# Patient Record
Sex: Female | Born: 1955
Health system: Southern US, Community
[De-identification: ages and names within clinical notes are randomized; demographics above are authoritative.]

## PROBLEM LIST (undated history)

## (undated) DIAGNOSIS — R42 Dizziness and giddiness: Secondary | ICD-10-CM

## (undated) DIAGNOSIS — M810 Age-related osteoporosis without current pathological fracture: Secondary | ICD-10-CM

## (undated) DIAGNOSIS — D219 Benign neoplasm of connective and other soft tissue, unspecified: Secondary | ICD-10-CM

## (undated) HISTORY — DX: Benign neoplasm of connective and other soft tissue, unspecified: D21.9

## (undated) HISTORY — DX: Age-related osteoporosis without current pathological fracture: M81.0

## (undated) HISTORY — PX: HERNIA REPAIR: SHX51

---

## 1997-02-21 HISTORY — PX: MYOMECTOMY VAGINAL APPROACH: SUR871

## 2005-08-10 ENCOUNTER — Other Ambulatory Visit: Admission: RE | Admit: 2005-08-10 | Discharge: 2005-08-10 | Payer: Self-pay | Admitting: Obstetrics and Gynecology

## 2005-09-28 ENCOUNTER — Encounter: Admission: RE | Admit: 2005-09-28 | Discharge: 2005-09-28 | Payer: Self-pay | Admitting: Obstetrics and Gynecology

## 2005-12-02 ENCOUNTER — Emergency Department (HOSPITAL_COMMUNITY): Admission: EM | Admit: 2005-12-02 | Discharge: 2005-12-02 | Payer: Self-pay | Admitting: Family Medicine

## 2006-08-15 ENCOUNTER — Other Ambulatory Visit: Admission: RE | Admit: 2006-08-15 | Discharge: 2006-08-15 | Payer: Self-pay | Admitting: Obstetrics and Gynecology

## 2006-09-11 ENCOUNTER — Emergency Department (HOSPITAL_COMMUNITY): Admission: EM | Admit: 2006-09-11 | Discharge: 2006-09-11 | Payer: Self-pay | Admitting: Emergency Medicine

## 2006-10-03 ENCOUNTER — Encounter: Admission: RE | Admit: 2006-10-03 | Discharge: 2006-10-03 | Payer: Self-pay | Admitting: Obstetrics and Gynecology

## 2007-09-26 ENCOUNTER — Other Ambulatory Visit: Admission: RE | Admit: 2007-09-26 | Discharge: 2007-09-26 | Payer: Self-pay | Admitting: Obstetrics and Gynecology

## 2008-04-08 ENCOUNTER — Encounter: Admission: RE | Admit: 2008-04-08 | Discharge: 2008-04-08 | Payer: Self-pay | Admitting: Obstetrics and Gynecology

## 2008-04-25 ENCOUNTER — Ambulatory Visit (HOSPITAL_COMMUNITY): Admission: RE | Admit: 2008-04-25 | Discharge: 2008-04-25 | Payer: Self-pay | Admitting: Gastroenterology

## 2008-10-30 ENCOUNTER — Encounter: Payer: Self-pay | Admitting: Obstetrics and Gynecology

## 2008-10-30 ENCOUNTER — Other Ambulatory Visit: Admission: RE | Admit: 2008-10-30 | Discharge: 2008-10-30 | Payer: Self-pay | Admitting: Obstetrics and Gynecology

## 2008-10-30 ENCOUNTER — Ambulatory Visit: Payer: Self-pay | Admitting: Obstetrics and Gynecology

## 2008-12-02 ENCOUNTER — Ambulatory Visit: Payer: Self-pay | Admitting: Obstetrics and Gynecology

## 2009-05-05 ENCOUNTER — Encounter: Admission: RE | Admit: 2009-05-05 | Discharge: 2009-05-05 | Payer: Self-pay | Admitting: Obstetrics and Gynecology

## 2009-05-07 ENCOUNTER — Encounter: Admission: RE | Admit: 2009-05-07 | Discharge: 2009-05-07 | Payer: Self-pay | Admitting: Obstetrics and Gynecology

## 2009-11-09 ENCOUNTER — Ambulatory Visit: Payer: Self-pay | Admitting: Obstetrics and Gynecology

## 2009-11-09 ENCOUNTER — Other Ambulatory Visit: Admission: RE | Admit: 2009-11-09 | Discharge: 2009-11-09 | Payer: Self-pay | Admitting: Obstetrics and Gynecology

## 2009-11-12 ENCOUNTER — Encounter: Admission: RE | Admit: 2009-11-12 | Discharge: 2009-11-12 | Payer: Self-pay | Admitting: Obstetrics and Gynecology

## 2010-06-17 ENCOUNTER — Other Ambulatory Visit: Payer: Self-pay | Admitting: Obstetrics and Gynecology

## 2010-06-17 DIAGNOSIS — Z09 Encounter for follow-up examination after completed treatment for conditions other than malignant neoplasm: Secondary | ICD-10-CM

## 2010-06-24 ENCOUNTER — Ambulatory Visit
Admission: RE | Admit: 2010-06-24 | Discharge: 2010-06-24 | Disposition: A | Discharge status: Home / Self Care | Payer: 59 | Source: Ambulatory Visit | Attending: Obstetrics and Gynecology | Admitting: Obstetrics and Gynecology

## 2010-06-24 ENCOUNTER — Other Ambulatory Visit: Payer: Self-pay | Admitting: Obstetrics and Gynecology

## 2010-06-24 DIAGNOSIS — Z09 Encounter for follow-up examination after completed treatment for conditions other than malignant neoplasm: Secondary | ICD-10-CM

## 2010-10-20 ENCOUNTER — Ambulatory Visit: Payer: 59 | Admitting: Obstetrics and Gynecology

## 2010-10-22 ENCOUNTER — Encounter: Payer: Self-pay | Admitting: Gynecology

## 2010-10-22 DIAGNOSIS — M8580 Other specified disorders of bone density and structure, unspecified site: Secondary | ICD-10-CM | POA: Insufficient documentation

## 2010-10-27 ENCOUNTER — Ambulatory Visit (INDEPENDENT_AMBULATORY_CARE_PROVIDER_SITE_OTHER): Payer: 59 | Admitting: Obstetrics and Gynecology

## 2010-10-27 ENCOUNTER — Encounter: Payer: Self-pay | Admitting: Obstetrics and Gynecology

## 2010-10-27 DIAGNOSIS — Z780 Asymptomatic menopausal state: Secondary | ICD-10-CM

## 2010-10-27 DIAGNOSIS — N951 Menopausal and female climacteric states: Secondary | ICD-10-CM

## 2010-10-27 DIAGNOSIS — N950 Postmenopausal bleeding: Secondary | ICD-10-CM

## 2010-10-27 NOTE — Progress Notes (Signed)
Patient came to see me today with the following complaints. For the past 6+ weeks she has had PMS type symptoms [place] if she's can start a period.several years ago she had an elevated FSH at over 100. She was on Vagifem but stopped it 8 weeks ago. She has now been spotting for the past week.  Ext:wnl, vaginal exam within normal limits. Cervix is clean and closed. Uterus is retroverted normal size and shape. Adnexa fails to reveal masses.  Assessment: 1. Menstrual cramping 2. Postmenopausal bleeding.  Plan: Endometrial biopsy obtained with very little tissue. FSH rechecked. [facility] scheduled.

## 2010-11-12 ENCOUNTER — Other Ambulatory Visit: Payer: Self-pay | Admitting: Dermatology

## 2010-11-16 ENCOUNTER — Encounter: Payer: Self-pay | Admitting: Obstetrics and Gynecology

## 2010-11-16 ENCOUNTER — Other Ambulatory Visit (HOSPITAL_COMMUNITY)
Admission: RE | Admit: 2010-11-16 | Discharge: 2010-11-16 | Disposition: A | Discharge status: Home / Self Care | Payer: 59 | Source: Ambulatory Visit | Attending: Obstetrics and Gynecology | Admitting: Obstetrics and Gynecology

## 2010-11-16 ENCOUNTER — Ambulatory Visit (INDEPENDENT_AMBULATORY_CARE_PROVIDER_SITE_OTHER): Payer: 59 | Admitting: Obstetrics and Gynecology

## 2010-11-16 VITALS — BP 120/74 | Ht 64.0 in | Wt 122.0 lb

## 2010-11-16 DIAGNOSIS — Z01419 Encounter for gynecological examination (general) (routine) without abnormal findings: Secondary | ICD-10-CM | POA: Insufficient documentation

## 2010-11-16 DIAGNOSIS — N950 Postmenopausal bleeding: Secondary | ICD-10-CM

## 2010-11-16 NOTE — Progress Notes (Signed)
Patient came to see me today for her annual GYN exam. She's had no further bleeding since we saw her the other day and did her endometrial biopsy. It came back normal. Her FSH is still in menopausal range. She continues to have mammograms on time. She has an appointment for [facility] tomorrow but needs to reschedule.  Physical examination: HEENT within normal limits. Neck: Thyroid not large. No masses. Supraclavicular nodes: not enlarged. Breasts: Examined in both sitting midline position. No skin changes and no masses. Abdomen: Soft no guarding rebound or masses or hernia. Pelvic: External: Within normal limits. BUS: Within normal limits. Vaginal:within normal limits. Good estrogen effect. No evidence of cystocele rectocele or enterocele. Cervix: clean. Uterus: Normal size and shape. Adnexa: No masses. Rectovaginal exam: Confirmatory and negative. Extremities: Within normal limits.  Assessment: Postmenopausal bleeding  Plan: Proceed with [facility]

## 2010-11-17 ENCOUNTER — Ambulatory Visit: Payer: 59 | Admitting: Obstetrics and Gynecology

## 2010-11-17 ENCOUNTER — Inpatient Hospital Stay: Admission: RE | Admit: 2010-11-17 | Payer: 59 | Source: Ambulatory Visit

## 2010-12-21 ENCOUNTER — Ambulatory Visit (INDEPENDENT_AMBULATORY_CARE_PROVIDER_SITE_OTHER): Payer: 59 | Admitting: Obstetrics and Gynecology

## 2010-12-21 ENCOUNTER — Ambulatory Visit (INDEPENDENT_AMBULATORY_CARE_PROVIDER_SITE_OTHER): Payer: 59

## 2010-12-21 ENCOUNTER — Other Ambulatory Visit: Payer: Self-pay | Admitting: Obstetrics and Gynecology

## 2010-12-21 DIAGNOSIS — D259 Leiomyoma of uterus, unspecified: Secondary | ICD-10-CM

## 2010-12-21 DIAGNOSIS — N950 Postmenopausal bleeding: Secondary | ICD-10-CM

## 2010-12-21 DIAGNOSIS — D251 Intramural leiomyoma of uterus: Secondary | ICD-10-CM

## 2010-12-21 DIAGNOSIS — D219 Benign neoplasm of connective and other soft tissue, unspecified: Secondary | ICD-10-CM

## 2010-12-21 DIAGNOSIS — D252 Subserosal leiomyoma of uterus: Secondary | ICD-10-CM

## 2010-12-21 DIAGNOSIS — N83339 Acquired atrophy of ovary and fallopian tube, unspecified side: Secondary | ICD-10-CM

## 2010-12-21 DIAGNOSIS — N854 Malposition of uterus: Secondary | ICD-10-CM

## 2010-12-21 NOTE — Progress Notes (Signed)
Patient came back for a saline infusion histogram because of postmenopausal bleeding. She had a normal endometrial biopsy. She says that since then she's had no bleeding. She's also had no more PMS for the last month.  Assessment: Postmenopausal bleeding  Plan: The patient had an ultrasound. She has multiple intramural and subserosal myomas. There all small. See ultrasound report for size. The largest is 2 cm. Her endometrial echo is 2.1 mm. Neither ovary has pathology. Her cul-de-sac is free of fluid. Saline was introduced into the cavity and she has a symmetrical cavity without intrauterine defects. She was reassured. She will report new bleeding.

## 2011-06-14 ENCOUNTER — Encounter (HOSPITAL_COMMUNITY): Payer: Self-pay | Admitting: Emergency Medicine

## 2011-06-14 ENCOUNTER — Emergency Department (INDEPENDENT_AMBULATORY_CARE_PROVIDER_SITE_OTHER)
Admission: EM | Admit: 2011-06-14 | Discharge: 2011-06-14 | Disposition: A | Discharge status: Home / Self Care | Payer: 59 | Source: Home / Self Care | Attending: Emergency Medicine | Admitting: Emergency Medicine

## 2011-06-14 DIAGNOSIS — K529 Noninfective gastroenteritis and colitis, unspecified: Secondary | ICD-10-CM

## 2011-06-14 DIAGNOSIS — K5289 Other specified noninfective gastroenteritis and colitis: Secondary | ICD-10-CM

## 2011-06-14 HISTORY — DX: Dizziness and giddiness: R42

## 2011-06-14 MED ORDER — ONDANSETRON 8 MG PO TBDP: 8.0000 mg | ORAL_TABLET | Freq: Three times a day (TID) | ORAL | Status: AC | PRN

## 2011-06-14 MED ORDER — MECLIZINE HCL 25 MG PO TABS: 25.0000 mg | ORAL_TABLET | Freq: Four times a day (QID) | ORAL | Status: AC

## 2011-06-14 MED ORDER — SODIUM CHLORIDE 0.9 % IV SOLN
INTRAVENOUS | Status: DC
  Administered 2011-06-14 (×2): via INTRAVENOUS

## 2011-06-14 MED ORDER — ONDANSETRON HCL 4 MG/2ML IJ SOLN
4.0000 mg | Freq: Once | INTRAMUSCULAR | Status: AC
  Administered 2011-06-14: 4 mg via INTRAVENOUS

## 2011-06-14 MED ORDER — ONDANSETRON HCL 4 MG/2ML IJ SOLN
INTRAMUSCULAR | Status: AC
  Filled 2011-06-14: qty 2

## 2011-06-14 NOTE — ED Notes (Signed)
Provided blanket and pillow

## 2011-06-14 NOTE — ED Notes (Signed)
Reports feeling fine until 2:45 pm.  Felt dizzy and started throwing up.  Currently has abdominal pain, nausea, dizzy, no diarrhea, yet.  Reports eating a [place] wrap, yogurt with blueberries-ate around 12:30 p.m. Today.  Woke this am with slight head stuffiness, headache -thought to be allergies-excedrin .

## 2011-06-14 NOTE — Discharge Instructions (Signed)

## 2011-06-14 NOTE — ED Notes (Signed)
Patient is resting comfortably. States nausea eased and feels some better.

## 2011-06-14 NOTE — ED Notes (Signed)
Report to marie byrd, rn

## 2011-06-14 NOTE — ED Provider Notes (Signed)
Chief Complaint  Patient presents with  . 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 Today while she was working, doing pulmonary function tests, she developed sudden onset of nausea and vomiting. She denies any abdominal pain or cramping. She didn't have any diarrhea. She felt chilled and noted  whirling vertigo. She has had vertigo in the past. She thinks that the nausea preceded the vertigo. She also notes some headache and nasal congestion. She denies any exposures. No suspicious ingestions, foreign travel, or animal exposure.  Review of Systems:  Other than noted above, the patient denies any of the following symptoms: Systemic:  No fevers, chills, sweats, weight loss or gain, fatigue, or tiredness. ENT:  No nasal congestion, rhinorrhea, or sore throat. Lungs:  No cough, wheezing, or shortness of breath. Cardiac:  No chest pain, syncope, or presyncope. GI:  No abdominal pain, nausea, vomiting, anorexia, diarrhea, constipation, blood in stool or vomitus. GU:  No dysuria, frequency, or urgency.  PMFSH:  Past medical history, family history, social history, meds, and allergies were reviewed.  Physical Exam:   Vital signs:  BP 118/71  Pulse 76  Temp(Src) 97.7 F (36.5 C) (Oral)  Resp 16  SpO2 99%  LMP 10/22/2008 General:  Alert and oriented.  In no distress.  Skin warm and dry.  Good skin turgor, brisk capillary refill. ENT:  No scleral icterus, moist mucous membranes, no oral lesions, pharynx clear. Lungs:  Breath sounds clear and equal bilaterally.  No wheezes, rales, or rhonchi. Heart:  Rhythm regular, without extrasystoles.  No gallops or murmers. Abdomen:  Abdomen was soft, flat, nontender, nondistended. There was no organomegaly or mass. Bowel sounds are normally active. Skin: Clear, warm, and dry.  Good turgor.  Brisk capillary refill.  Course in Urgent Care Center:   She was given a liter of normal saline IV and  Zofran 4 mg IV. Thereafter she felt quite a bit better. She did not have any further emesis. Her dizziness was improved. She felt like should could go home.  Assessment:  The encounter diagnosis was Gastroenteritis.  Plan:   1.  The following meds were prescribed:   New Prescriptions   MECLIZINE (ANTIVERT) 25 MG TABLET    Take 1 tablet (25 mg total) by mouth 4 (four) times daily.   ONDANSETRON (ZOFRAN ODT) 8 MG DISINTEGRATING TABLET    Take 1 tablet (8 mg total) by mouth every 8 (eight) hours as needed for nausea.   2.  The patient was instructed in symptomatic care and handouts were given. 3.  The patient was told to return if becoming worse in any way, if no better in 2 or 3 days, and given some red flag symptoms that would indicate earlier return. 4.  The patient was told to take only sips of clear liquids for the next 24 hours and then advance to a b.r.a.t. Diet.      Reuben Likes, MD 06/14/11 [phone]

## 2011-07-06 ENCOUNTER — Other Ambulatory Visit: Payer: Self-pay | Admitting: *Deleted

## 2011-07-06 DIAGNOSIS — N649 Disorder of breast, unspecified: Secondary | ICD-10-CM

## 2011-07-22 ENCOUNTER — Ambulatory Visit
Admission: RE | Admit: 2011-07-22 | Discharge: 2011-07-22 | Disposition: A | Discharge status: Home / Self Care | Payer: 59 | Source: Ambulatory Visit | Attending: Obstetrics and Gynecology | Admitting: Obstetrics and Gynecology

## 2011-07-22 ENCOUNTER — Other Ambulatory Visit: Payer: 59

## 2011-07-22 DIAGNOSIS — N649 Disorder of breast, unspecified: Secondary | ICD-10-CM

## 2011-08-18 ENCOUNTER — Telehealth: Payer: Self-pay | Admitting: *Deleted

## 2011-08-18 MED ORDER — ESTRADIOL 0.1 MG/GM VA CREA: TOPICAL_CREAM | VAGINAL | Status: DC

## 2011-08-18 NOTE — Telephone Encounter (Signed)
Pt informed with the below note, rx sent to pharmacy. 

## 2011-08-18 NOTE — Addendum Note (Signed)
Addended by: Aura Camps on: 08/18/2011 03:12 PM   Modules accepted: Orders

## 2011-08-18 NOTE — Telephone Encounter (Signed)
Estrace vaginal cream 1 g at bedtime in  her vagina for 2 weeks and then 3 times a week

## 2011-08-18 NOTE — Telephone Encounter (Signed)
Pt is calling requesting rx for vaginal dryness, pt has taken vagifem in past before, but states "that doesn't work good" pt would to try something else. Does pt need OV to discuss? Please advise

## 2011-11-17 ENCOUNTER — Ambulatory Visit (INDEPENDENT_AMBULATORY_CARE_PROVIDER_SITE_OTHER): Payer: 59 | Admitting: Obstetrics and Gynecology

## 2011-11-17 ENCOUNTER — Encounter: Payer: Self-pay | Admitting: Obstetrics and Gynecology

## 2011-11-17 VITALS — BP 114/70 | Ht 64.0 in | Wt 121.0 lb

## 2011-11-17 DIAGNOSIS — Z01419 Encounter for gynecological examination (general) (routine) without abnormal findings: Secondary | ICD-10-CM

## 2011-11-17 MED ORDER — ESTRADIOL 0.1 MG/GM VA CREA: TOPICAL_CREAM | VAGINAL | Status: DC

## 2011-11-17 NOTE — Progress Notes (Signed)
Patient came to see me today for her annual GYN exam. She uses vaginal estrogen cream with excellent results both for atrophic vaginitis and urinary frequency. She is having no vaginal bleeding. She is having no pelvic pain. She is not having systemic menopausal symptoms. She has always had normal Pap smears. Her last pap smear was 2012. She is up-to-date on mammograms. She is now back on routine screening after being watched carefully with a breast cyst. 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 Her baseline bone density showed minimal osteopenia without an elevated fracture risk. She has had no fractures. It is time for followup.  Physical examination:Margaret Dougherty present.HEENT within normal limits. Neck: Thyroid not large. No masses. Supraclavicular nodes: not enlarged. Breasts: Examined in both sitting and lying  position. No skin changes and no masses. Abdomen: Soft no guarding rebound or masses or hernia. Pelvic: External: Within normal limits. BUS: Within normal limits. Vaginal:within normal limits. Good estrogen effect. No evidence of cystocele rectocele or enterocele. Cervix: clean. Uterus: Normal size and shape. Adnexa: No masses. Rectovaginal exam: Confirmatory and negative. Extremities: Within normal limits.  Assessment: #1. Atrophic vaginitis #2. Osteopenia #3. Urinary frequency  Plan: Continue yearly mammograms. Continue vaginal estrogen cream. Followup bone density.The new Pap smear guidelines were discussed with the patient. No pap done.

## 2011-11-17 NOTE — Patient Instructions (Signed)
Schedule bone density at our office.

## 2011-11-18 LAB — URINALYSIS W MICROSCOPIC + REFLEX CULTURE
Bacteria, UA: NONE SEEN
Bilirubin Urine: NEGATIVE
Casts: NONE SEEN
Crystals: NONE SEEN
Glucose, UA: NEGATIVE mg/dL
Hgb urine dipstick: NEGATIVE
Ketones, ur: NEGATIVE mg/dL
Leukocytes, UA: NEGATIVE
Nitrite: NEGATIVE
Protein, ur: NEGATIVE mg/dL
Specific Gravity, Urine: 1.011 (ref 1.005–1.030)
Squamous Epithelial / LPF: NONE SEEN
Urobilinogen, UA: 0.2 mg/dL (ref 0.0–1.0)
pH: 6 (ref 5.0–8.0)

## 2011-11-23 ENCOUNTER — Encounter: Payer: Self-pay | Admitting: Obstetrics and Gynecology

## 2012-05-17 ENCOUNTER — Ambulatory Visit: Payer: Self-pay

## 2012-05-17 ENCOUNTER — Other Ambulatory Visit: Payer: Self-pay | Admitting: Occupational Medicine

## 2012-05-17 DIAGNOSIS — R7612 Nonspecific reaction to cell mediated immunity measurement of gamma interferon antigen response without active tuberculosis: Secondary | ICD-10-CM

## 2013-02-04 ENCOUNTER — Ambulatory Visit (INDEPENDENT_AMBULATORY_CARE_PROVIDER_SITE_OTHER): Payer: 59 | Admitting: Gynecology

## 2013-02-04 ENCOUNTER — Encounter: Payer: Self-pay | Admitting: Gynecology

## 2013-02-04 VITALS — BP 104/68 | Ht 65.0 in | Wt 115.6 lb

## 2013-02-04 DIAGNOSIS — M8580 Other specified disorders of bone density and structure, unspecified site: Secondary | ICD-10-CM

## 2013-02-04 DIAGNOSIS — M899 Disorder of bone, unspecified: Secondary | ICD-10-CM

## 2013-02-04 DIAGNOSIS — N952 Postmenopausal atrophic vaginitis: Secondary | ICD-10-CM

## 2013-02-04 DIAGNOSIS — Z01419 Encounter for gynecological examination (general) (routine) without abnormal findings: Secondary | ICD-10-CM

## 2013-02-04 DIAGNOSIS — D251 Intramural leiomyoma of uterus: Secondary | ICD-10-CM

## 2013-02-04 MED ORDER — ESTRADIOL 0.1 MG/GM VA CREA: TOPICAL_CREAM | VAGINAL | Status: DC

## 2013-02-04 NOTE — Progress Notes (Signed)
Margaret Dougherty 1955-10-14 [id number]        57 y.o.  [id number] for annual exam.  Former patient of Dr. Eda Paschal. Several issues noted below.  Past medical history,surgical history, problem list, medications, allergies, family history and social history were all reviewed and documented in the EPIC chart.  ROS:  Performed and pertinent positives and negatives are included in the history, assessment and plan .  Exam:  Margaret Dougherty assistant Filed Vitals:   02/04/13 1509  BP: 104/68  Height: 5\' 5"  (1.651 m)  Weight: 115 lb 9.6 oz (52.436 kg)   General appearance  Normal Skin grossly normal Head/Neck normal with no cervical or supraclavicular adenopathy thyroid normal Lungs  clear Cardiac RR, without RMG Abdominal  soft, nontender, without masses, organomegaly or hernia Breasts  examined lying and sitting without masses, retractions, discharge or axillary adenopathy. Pelvic  Ext/BUS/vagina  generalized atrophic changes  Cervix  Normal with atrophic changes  Uterus  anteverted, normal size, shape and contour, midline and mobile nontender   Adnexa  Without masses or tenderness    Anus and perineum  Normal   Rectovaginal  Normal sphincter tone without palpated masses or tenderness.    Assessment/Plan:  57 y.o. [id number] female for annual exam.   1. Postmenopausal/atrophic genital changes. Not having significant symptoms such as hot flashes or night sweats. No vaginal bleeding. Using Estrace cream 3 times weekly. Seems to help more with her bladder control than anything. Patient wants to continue. I reviewed the issue of vaginal estrogen absorption risks with thrombosis such as stroke heart attack DVT, endometrial stimulation and breast cancer issues. Patient knows to report any vaginal bleeding. I refilled her Estrace cream x1 year. 2. Pap smear 2012. No Pap smear done today. No history of abnormal Pap smears previously. Plan repeat Pap smear next year a 3 year interval. 3. Mammography 06/2011.  Patient knows she is overdue and agrees to schedule. SBE monthly reviewed. 4. Osteopenia. DEXA 2010 with T score -1.2. FRAX 4.4%/0.3%. Repeat DEXA now patient agrees to schedule. Increase calcium vitamin D reviewed. 5. Colonoscopy 8 years ago with planned repeat at 10 year interval. 6. Health maintenance. No blood work done as it is all done through her primary physician's office. Followup one year, sooner as needed.   Note: This document was prepared with digital dictation and possible smart phrase technology. Any transcriptional errors that result from this process are unintentional.   Margaret Lords MD, 3:42 PM 02/04/2013

## 2013-02-04 NOTE — Patient Instructions (Signed)
Followup for bone density as scheduled. Followup in one year for annual exam 

## 2013-02-05 LAB — URINALYSIS W MICROSCOPIC + REFLEX CULTURE
Bacteria, UA: NONE SEEN
Bilirubin Urine: NEGATIVE
Casts: NONE SEEN
Crystals: NONE SEEN
Glucose, UA: NEGATIVE mg/dL
Hgb urine dipstick: NEGATIVE
Ketones, ur: NEGATIVE mg/dL
Leukocytes, UA: NEGATIVE
Nitrite: NEGATIVE
Protein, ur: NEGATIVE mg/dL
Specific Gravity, Urine: 1.006 (ref 1.005–1.030)
Squamous Epithelial / LPF: NONE SEEN
Urobilinogen, UA: 0.2 mg/dL (ref 0.0–1.0)
pH: 6.5 (ref 5.0–8.0)

## 2013-04-04 ENCOUNTER — Encounter: Payer: Self-pay | Admitting: Gynecology

## 2013-04-04 ENCOUNTER — Telehealth: Payer: Self-pay | Admitting: Gynecology

## 2013-04-04 ENCOUNTER — Ambulatory Visit (INDEPENDENT_AMBULATORY_CARE_PROVIDER_SITE_OTHER): Payer: 59

## 2013-04-04 DIAGNOSIS — M8580 Other specified disorders of bone density and structure, unspecified site: Secondary | ICD-10-CM

## 2013-04-04 DIAGNOSIS — M898X9 Other specified disorders of bone, unspecified site: Secondary | ICD-10-CM

## 2013-04-04 DIAGNOSIS — M899 Disorder of bone, unspecified: Secondary | ICD-10-CM

## 2013-04-04 DIAGNOSIS — M949 Disorder of cartilage, unspecified: Secondary | ICD-10-CM

## 2013-04-04 NOTE — Telephone Encounter (Signed)
Tell patient that her bone density shows that she has lost a fair amount of calcium. She is not osteoporotic but has lost bone. Recommend making sure there are no secondary reasons and that she come in to have a comprehensive metabolic panel, TSH, PTH and vitamin D level drawn. Weightbearing exercise and maximizing calcium total dietary at 1200 mg a day important.

## 2013-04-05 ENCOUNTER — Other Ambulatory Visit: Payer: 59

## 2013-04-05 DIAGNOSIS — M898X9 Other specified disorders of bone, unspecified site: Secondary | ICD-10-CM

## 2013-04-05 DIAGNOSIS — M8580 Other specified disorders of bone density and structure, unspecified site: Secondary | ICD-10-CM

## 2013-04-05 LAB — COMPREHENSIVE METABOLIC PANEL
ALT: 12 U/L (ref 0–35)
AST: 19 U/L (ref 0–37)
Albumin: 4.3 g/dL (ref 3.5–5.2)
Alkaline Phosphatase: 58 U/L (ref 39–117)
BUN: 12 mg/dL (ref 6–23)
CO2: 27 mEq/L (ref 19–32)
Calcium: 9.3 mg/dL (ref 8.4–10.5)
Chloride: 99 mEq/L (ref 96–112)
Creat: 0.64 mg/dL (ref 0.50–1.10)
Glucose, Bld: 127 mg/dL — ABNORMAL HIGH (ref 70–99)
Potassium: 4.1 mEq/L (ref 3.5–5.3)
Sodium: 139 mEq/L (ref 135–145)
Total Bilirubin: 0.5 mg/dL (ref 0.2–1.2)
Total Protein: 7.1 g/dL (ref 6.0–8.3)

## 2013-04-05 LAB — TSH: TSH: 1.946 u[IU]/mL (ref 0.350–4.500)

## 2013-04-05 NOTE — Telephone Encounter (Signed)
Pt informed with the below note, orders placed. 

## 2013-04-06 LAB — VITAMIN D 25 HYDROXY (VIT D DEFICIENCY, FRACTURES): Vit D, 25-Hydroxy: 55 ng/mL (ref 30–89)

## 2013-04-08 LAB — PTH, INTACT AND CALCIUM
Calcium: 9.3 mg/dL (ref 8.4–10.5)
PTH: 58.1 pg/mL (ref 14.0–72.0)

## 2013-04-10 ENCOUNTER — Other Ambulatory Visit: Payer: Self-pay | Admitting: Gynecology

## 2013-04-10 DIAGNOSIS — R7309 Other abnormal glucose: Secondary | ICD-10-CM

## 2013-04-11 ENCOUNTER — Telehealth: Payer: Self-pay | Admitting: *Deleted

## 2013-04-11 NOTE — Telephone Encounter (Signed)
Pt asked detailed questions about her bone density when she was called with the results of her blood work. I called her back and gave detailed information about her bone density. KW CMA CBDT

## 2013-04-18 ENCOUNTER — Other Ambulatory Visit: Payer: Self-pay

## 2013-04-18 DIAGNOSIS — Z1231 Encounter for screening mammogram for malignant neoplasm of breast: Secondary | ICD-10-CM

## 2013-05-07 ENCOUNTER — Ambulatory Visit
Admission: RE | Admit: 2013-05-07 | Discharge: 2013-05-07 | Disposition: A | Discharge status: Home / Self Care | Payer: 59 | Source: Ambulatory Visit

## 2013-05-07 DIAGNOSIS — Z1231 Encounter for screening mammogram for malignant neoplasm of breast: Secondary | ICD-10-CM

## 2013-12-23 ENCOUNTER — Encounter: Payer: Self-pay | Admitting: Gynecology

## 2014-02-25 ENCOUNTER — Other Ambulatory Visit: Payer: Self-pay | Admitting: *Deleted

## 2014-02-25 MED ORDER — ESTRADIOL 0.1 MG/GM VA CREA: TOPICAL_CREAM | VAGINAL | Status: DC

## 2014-03-28 IMAGING — US US BREAST*R*
1 series · 4 of 4 positions shown · non-contrast
Comparison: Multiple priors

CLINICAL DATA: Follow-up right breast mass

DIGITAL DIAGNOSTIC BILATERAL MAMMOGRAM WITH CAD AND RIGHT BREAST
ULTRASOUND:

[Series 1: us breast*right* · 4 of 4 slices shown]
[im 1/4]
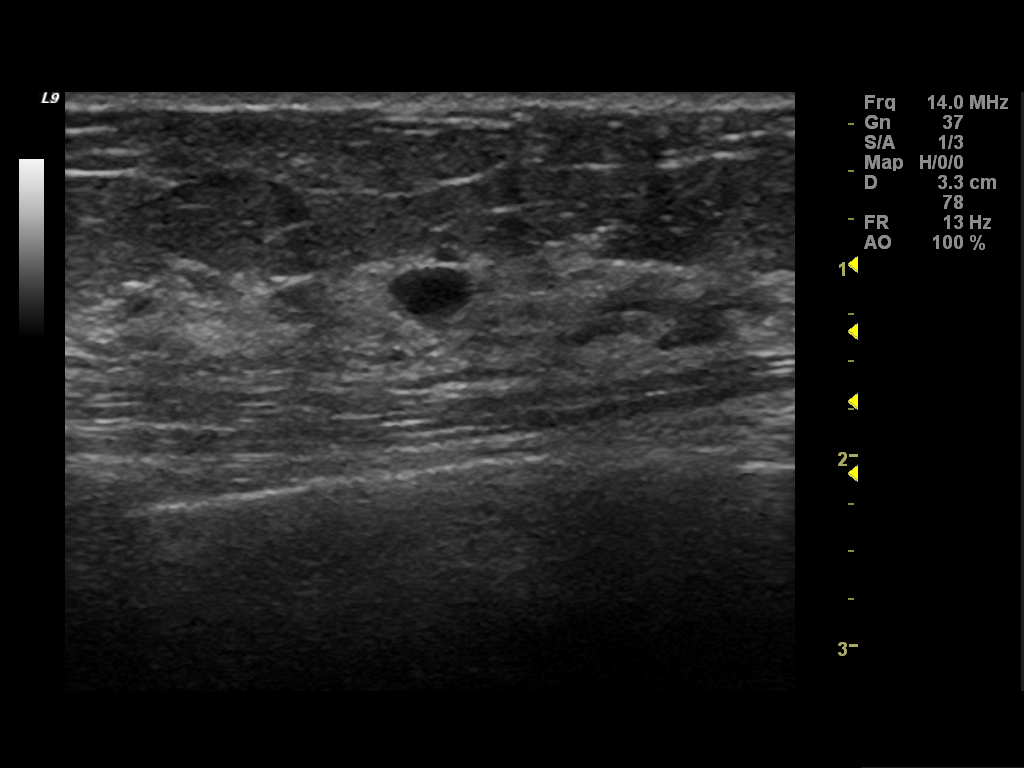
[im 2/4]
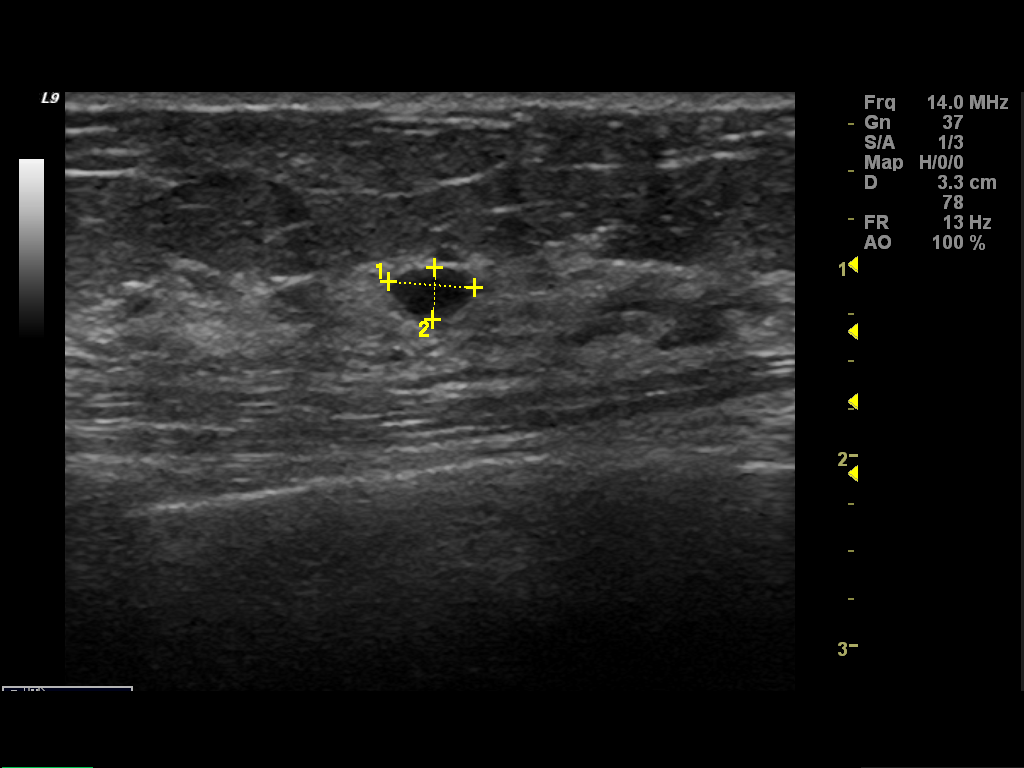
[im 3/4]
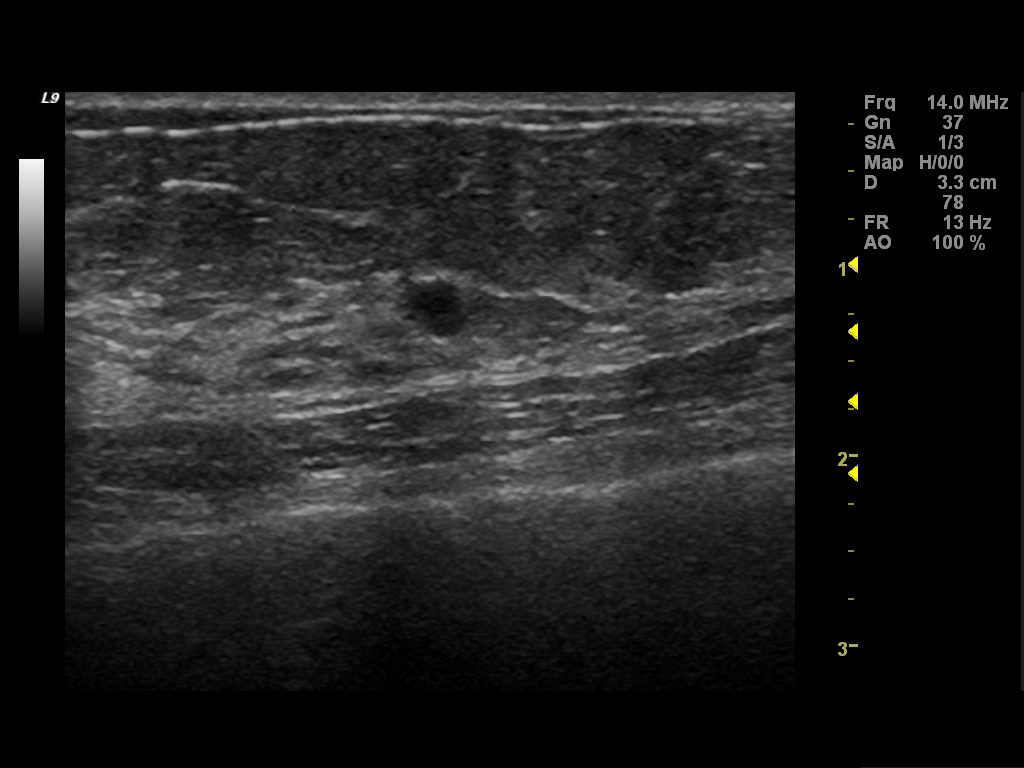
[im 4/4]
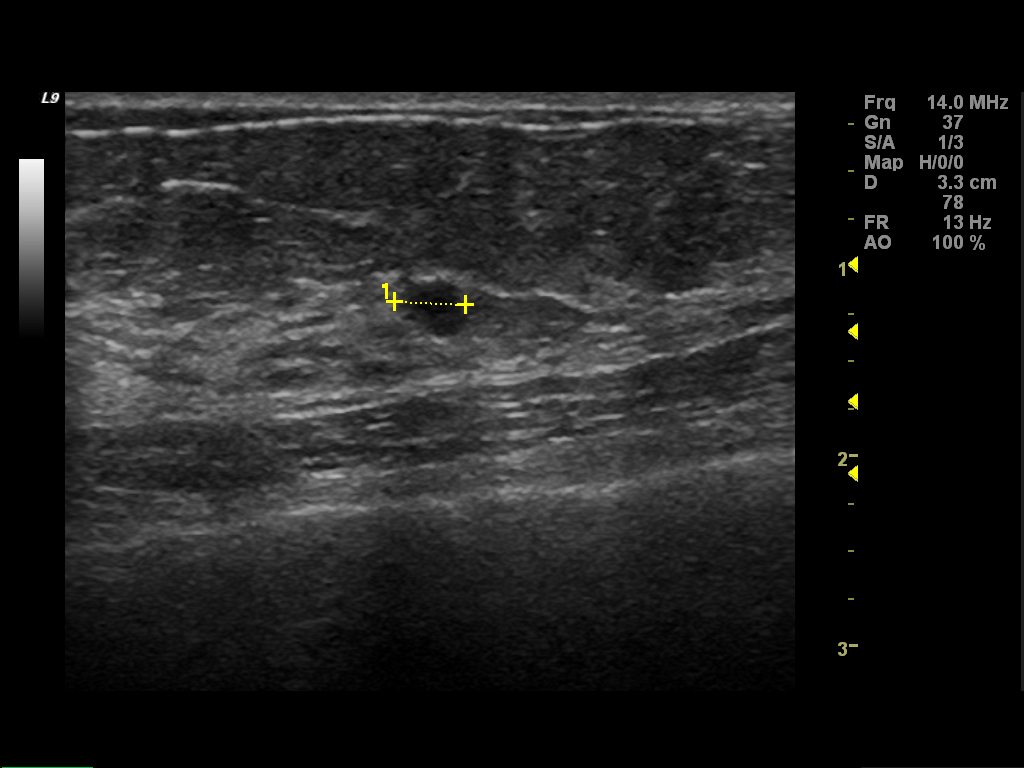

[4 of 4 positions shown; findings below may reference images not displayed]

FINDINGS: The breast tissue is heterogeneously dense.  No new mass
or malignant type calcifications are seen.  Compared to priors.
Mammographic images were processed with CAD.

On physical exam, no mass is palpated in the medial central aspect
of the right breast.

Ultrasound is performed, showing an oval nearly anechoic masses
circumscribed margins in the 3 o'clock position, 2 cm the nipple
measuring 0.5 x 0.3 x 0.4 cm.  This is compatible with a minimally
complicated cyst and is unchanged dating back to April 2009.
IMPRESSION: Benign findings, right breast.  No mammographic evidence of
malignancy, left breast.  Recommend screening mammography in 1
year.

BI-RADS CATEGORY 2:  Benign finding(s).

## 2014-10-30 ENCOUNTER — Other Ambulatory Visit: Payer: Self-pay | Admitting: Gynecology

## 2014-11-21 ENCOUNTER — Telehealth: Payer: Self-pay | Admitting: *Deleted

## 2014-11-21 NOTE — Telephone Encounter (Signed)
Pt called requesting refill on estrace vaginal cream last annual Dec 2014, has annual scheduled on 02/05/15. Please advise

## 2014-11-21 NOTE — Telephone Encounter (Signed)
I would accelerate her annual exam. She can be of 430 appointment one day if needed.  Do not feel comfortable refilling the medication as it's been almost 2 years since of seeing her.

## 2014-11-21 NOTE — Telephone Encounter (Signed)
Left the below on pt voicemail. 

## 2014-12-22 ENCOUNTER — Other Ambulatory Visit: Payer: Self-pay

## 2014-12-22 ENCOUNTER — Encounter: Payer: Self-pay | Admitting: Gynecology

## 2015-02-05 ENCOUNTER — Encounter: Payer: Self-pay | Admitting: Gynecology

## 2015-02-05 ENCOUNTER — Ambulatory Visit (INDEPENDENT_AMBULATORY_CARE_PROVIDER_SITE_OTHER): Payer: 59 | Admitting: Gynecology

## 2015-02-05 ENCOUNTER — Other Ambulatory Visit (HOSPITAL_COMMUNITY)
Admission: RE | Admit: 2015-02-05 | Discharge: 2015-02-05 | Disposition: A | Discharge status: Home / Self Care | Payer: 59 | Source: Ambulatory Visit | Attending: Gynecology | Admitting: Gynecology

## 2015-02-05 VITALS — BP 116/64 | Ht 65.0 in | Wt 109.0 lb

## 2015-02-05 DIAGNOSIS — N952 Postmenopausal atrophic vaginitis: Secondary | ICD-10-CM | POA: Diagnosis not present

## 2015-02-05 DIAGNOSIS — M8580 Other specified disorders of bone density and structure, unspecified site: Secondary | ICD-10-CM

## 2015-02-05 DIAGNOSIS — Z01419 Encounter for gynecological examination (general) (routine) without abnormal findings: Secondary | ICD-10-CM

## 2015-02-05 MED ORDER — ESTRADIOL 0.1 MG/GM VA CREA: TOPICAL_CREAM | VAGINAL | Status: DC

## 2015-02-05 NOTE — Patient Instructions (Addendum)
Call to Schedule your mammogram  [facility]. [address] Phone: [phone]     Mammogram A mammogram is an X-ray test to find changes in a woman's breast. You should get a mammogram if:  You are 58 years of age or older  You have risk factors.   Your doctor recommends that you have one.  BEFORE THE TEST  Do not schedule the test the week before your period, especially if your breasts are sore during this time.  On the day of your mammogram:  Wash your breasts and armpits well. After washing, do not put on any deodorant or talcum powder on until after your test.   Eat and drink as you usually do.   Take your medicines as usual.   If you are diabetic and take insulin, make sure you:   Eat before coming for your test.   Take your insulin as usual.   If you cannot keep your appointment, call before the appointment to cancel. Schedule another appointment.  TEST  You will need to undress from the waist up. You will put on a hospital gown.   Your breast will be put on the mammogram machine, and it will press firmly on your breast with a piece of plastic called a compression paddle. This will make your breast flatter so that the machine can X-ray all parts of your breast.   Both breasts will be X-rayed. Each breast will be X-rayed from above and from the side. An X-ray might need to be taken again if the picture is not good enough.   The mammogram will last about 15 to 30 minutes.  AFTER THE TEST Finding out the results of your test Ask when your test results will be ready. Make sure you get your test results.  Document Released: 05/06/2008 Document Revised: 01/27/2011 Document Reviewed: 05/06/2008 [facility] Patient Information 2012 [place].  You may obtain a copy of any labs that were done today by logging onto MyChart as outlined in the instructions provided with your AVS (after visit summary).  The office will not call with normal lab results but certainly if there are any significant abnormalities then we will contact you.   Health Maintenance Adopting a healthy lifestyle and getting preventive care can go a long way to promote health and wellness. Talk with your health care provider about what schedule of regular examinations is right for you. This is a good chance for you to check in with your provider about disease prevention and staying healthy. In between checkups, there are plenty of things you can do on your own. Experts have done a lot of research about which lifestyle changes and preventive measures are most likely to keep you healthy. Ask your health care provider for more information. WEIGHT AND DIET  Eat a healthy diet  Be sure to include plenty of vegetables, fruits, low-fat dairy products, and lean protein.  Do not eat a lot of foods high in solid fats, added sugars, or salt.  Get regular exercise. This is one of the most important things you can do for your health. 1. Most adults should exercise for at least 150 minutes each week. The exercise should increase your heart rate and make you sweat (moderate-intensity exercise). 2. Most adults should also do strengthening exercises at least twice a week. This is in addition to the moderate-intensity exercise.  Maintain a healthy weight  Body mass index (BMI) is  a measurement that can be used to identify possible weight problems. It estimates body fat based on height and weight. Your health care provider can help determine your BMI and help you achieve or maintain a healthy weight.  For females 25 years of age and older:  1. A BMI below 18.5 is considered underweight. 2. A BMI of 18.5 to 24.9 is normal. 3. A BMI of 25 to 29.9 is considered overweight. 4. A BMI of 30 and above is considered obese.  Watch levels of cholesterol and blood lipids  You should start having your blood tested for lipids and cholesterol at 58  years of age, then have this test every 5 years.  You may need to have your cholesterol levels checked more often if: 1. Your lipid or cholesterol levels are high. 2. You are older than 58 years of age. 3. You are at high risk for heart disease.  CANCER SCREENING   Lung Cancer  Lung cancer screening is recommended for adults 86-48 years old who are at high risk for lung cancer because of a history of smoking.  A yearly low-dose CT scan of the lungs is recommended for people who: 1. Currently smoke. 2. Have quit within the past 15 years. 3. Have at least a 30-pack-year history of smoking. A pack year is smoking an average of one pack of cigarettes a day for 1 year.  Yearly screening should continue until it has been 15 years since you quit.  Yearly screening should stop if you develop a health problem that would prevent you from having lung cancer treatment.  Breast Cancer  Practice breast self-awareness. This means understanding how your breasts normally appear and feel.  It also means doing regular breast self-exams. Let your health care provider know about any changes, no matter how small.  If you are in your 20s or 30s, you should have a clinical breast exam (CBE) by a health care provider every 1-3 years as part of a regular health exam.  If you are 64 or older, have a CBE every year. Also consider having a breast X-ray (mammogram) every year.  If you have a family history of breast cancer, talk to your health care provider about genetic screening.  If you are at high risk for breast cancer, talk to your health care provider about having an MRI and a mammogram every year.  Breast cancer gene (BRCA) assessment is recommended for women who have family members with BRCA-related cancers. BRCA-related cancers include:  Breast.  Ovarian.  Tubal.  Peritoneal cancers.  Results of the assessment will determine the need for genetic counseling and BRCA1 and BRCA2  testing. Cervical Cancer Routine pelvic examinations to screen for cervical cancer are no longer recommended for nonpregnant women who are considered low risk for cancer of the pelvic organs (ovaries, uterus, and vagina) and who do not have symptoms. A pelvic examination may be necessary if you have symptoms including those associated with pelvic infections. Ask your health care provider if a screening pelvic exam is right for you.   The Pap test is the screening test for cervical cancer for women who are considered at risk.  If you had a hysterectomy for a problem that was not cancer or a condition that could lead to cancer, then you no longer need Pap tests.  If you are older than 65 years, and you have had normal Pap tests for the past 10 years, you no longer need to have Pap tests.  If you have had past treatment for cervical cancer or a condition that could lead to cancer, you need Pap tests and screening for cancer for at least 20 years after your treatment.  If you no longer get a Pap test, assess your risk factors if they change (such as having a new sexual partner). This can affect whether you should start being screened again.  Some women have medical problems that increase their chance of getting cervical cancer. If this is the case for you, your health care provider may recommend more frequent screening and Pap tests.  The human papillomavirus (HPV) test is another test that may be used for cervical cancer screening. The HPV test looks for the virus that can cause cell changes in the cervix. The cells collected during the Pap test can be tested for HPV.  The HPV test can be used to screen women 83 years of age and older. Getting tested for HPV can extend the interval between normal Pap tests from three to five years.  An HPV test also should be used to screen women of any age who have unclear Pap test results.  After 58 years of age, women should have HPV testing as often as Pap  tests.  Colorectal Cancer  This type of cancer can be detected and often prevented.  Routine colorectal cancer screening usually begins at 58 years of age and continues through 58 years of age.  Your health care provider may recommend screening at an earlier age if you have risk factors for colon cancer.  Your health care provider may also recommend using home test kits to check for hidden blood in the stool.  A small camera at the end of a tube can be used to examine your colon directly (sigmoidoscopy or colonoscopy). This is done to check for the earliest forms of colorectal cancer.  Routine screening usually begins at age 79.  Direct examination of the colon should be repeated every 5-10 years through 58 years of age. However, you may need to be screened more often if early forms of precancerous polyps or small growths are found. Skin Cancer  Check your skin from head to toe regularly.  Tell your health care provider about any new moles or changes in moles, especially if there is a change in a mole's shape or color.  Also tell your health care provider if you have a mole that is larger than the size of a pencil eraser.  Always use sunscreen. Apply sunscreen liberally and repeatedly throughout the day.  Protect yourself by wearing long sleeves, pants, a wide-brimmed hat, and sunglasses whenever you are outside. HEART DISEASE, DIABETES, AND HIGH BLOOD PRESSURE   Have your blood pressure checked at least every 1-2 years. High blood pressure causes heart disease and increases the risk of stroke.  If you are between 15 years and 35 years old, ask your health care provider if you should take aspirin to prevent strokes.  Have regular diabetes screenings. This involves taking a blood sample to check your fasting blood sugar level.  If you are at a normal weight and have a low risk for diabetes, have this test once every three years after 58 years of age.  If you are overweight and  have a high risk for diabetes, consider being tested at a younger age or more often. PREVENTING INFECTION  Hepatitis B  If you have a higher risk for hepatitis B, you should be screened for this virus. You are considered  at high risk for hepatitis B if:  You were born in a country where hepatitis B is common. Ask your health care provider which countries are considered high risk.  Your parents were born in a high-risk country, and you have not been immunized against hepatitis B (hepatitis B vaccine).  You have HIV or AIDS.  You use needles to inject street drugs.  You live with someone who has hepatitis B.  You have had sex with someone who has hepatitis B.  You get hemodialysis treatment.  You take certain medicines for conditions, including cancer, organ transplantation, and autoimmune conditions. Hepatitis C  Blood testing is recommended for:  Everyone born from [birth year] through 1964.  Anyone with known risk factors for hepatitis C. Sexually transmitted infections (STIs)  You should be screened for sexually transmitted infections (STIs) including gonorrhea and chlamydia if:  You are sexually active and are younger than 58 years of age.  You are older than 58 years of age and your health care provider tells you that you are at risk for this type of infection.  Your sexual activity has changed since you were last screened and you are at an increased risk for chlamydia or gonorrhea. Ask your health care provider if you are at risk.  If you do not have HIV, but are at risk, it may be recommended that you take a prescription medicine daily to prevent HIV infection. This is called pre-exposure prophylaxis (PrEP). You are considered at risk if:  You are sexually active and do not regularly use condoms or know the HIV status of your partner(s).  You take drugs by injection.  You are sexually active with a partner who has HIV. Talk with your health care provider about whether you  are at high risk of being infected with HIV. If you choose to begin PrEP, you should first be tested for HIV. You should then be tested every 3 months for as long as you are taking PrEP.  PREGNANCY   If you are premenopausal and you may become pregnant, ask your health care provider about preconception counseling.  If you may become pregnant, take 400 to 800 micrograms (mcg) of folic acid every day.  If you want to prevent pregnancy, talk to your health care provider about birth control (contraception). OSTEOPOROSIS AND MENOPAUSE   Osteoporosis is a disease in which the bones lose minerals and strength with aging. This can result in serious bone fractures. Your risk for osteoporosis can be identified using a bone density scan.  If you are 29 years of age or older, or if you are at risk for osteoporosis and fractures, ask your health care provider if you should be screened.  Ask your health care provider whether you should take a calcium or vitamin D supplement to lower your risk for osteoporosis.  Menopause may have certain physical symptoms and risks.  Hormone replacement therapy may reduce some of these symptoms and risks. Talk to your health care provider about whether hormone replacement therapy is right for you.  HOME CARE INSTRUCTIONS   Schedule regular health, dental, and eye exams.  Stay current with your immunizations.   Do not use any tobacco products including cigarettes, chewing tobacco, or electronic cigarettes.  If you are pregnant, do not drink alcohol.  If you are breastfeeding, limit how much and how often you drink alcohol.  Limit alcohol intake to no more than 1 drink per day for nonpregnant women. One drink equals 12 ounces of beer,  5 ounces of wine, or 1 ounces of hard liquor.  Do not use street drugs.  Do not share needles.  Ask your health care provider for help if you need support or information about quitting drugs.  Tell your health care provider if  you often feel depressed.  Tell your health care provider if you have ever been abused or do not feel safe at home. Document Released: 08/23/2010 Document Revised: 06/24/2013 Document Reviewed: 01/09/2013 [facility] Patient Information 2015 [place], [place]. This information is not intended to replace advice given to you by your health care provider. Make sure you discuss any questions you have with your health care provider.

## 2015-02-05 NOTE — Addendum Note (Signed)
Addended by: Nelva Nay on: 02/05/2015 09:56 AM   Modules accepted: Orders

## 2015-02-05 NOTE — Progress Notes (Signed)
Margaret Dougherty Jun 18, 1955 [id number]        59 y.o.  [id number]  for annual exam.  Several issues noted below.  Past medical history,surgical history, problem list, medications, allergies, family history and social history were all reviewed and documented as reviewed in the EPIC chart.  ROS:  Performed with pertinent positives and negatives included in the history, assessment and plan.   Additional significant findings :  none   Exam: Kim Counsellor Vitals:   02/05/15 0901  BP: 116/64  Height: 5\' 5"  (1.651 m)  Weight: 109 lb (49.442 kg)   General appearance:  Normal affect, orientation and appearance. Skin: Grossly normal HEENT: Without gross lesions.  No cervical or supraclavicular adenopathy. Thyroid normal.  Lungs:  Clear without wheezing, rales or rhonchi Cardiac: RR, without RMG Abdominal:  Soft, nontender, without masses, guarding, rebound, organomegaly or hernia Breasts:  Examined lying and sitting without masses, retractions, discharge or axillary adenopathy. Pelvic:  Ext/BUS/vagina with atrophic changes  Cervix with atrophic changes, flush with upper vagina. Pap smear done  Uterus retroverted, normal size, shape and contour, midline and mobile nontender   Adnexa  Without masses or tenderness    Anus and perineum  Normal   Rectovaginal  Normal sphincter tone without palpated masses or tenderness.    Assessment/Plan:  59 y.o. [id number] female for annual exam.   1. Postmenopausal/atrophic genital changes. Was using Estrace vaginal cream 3 times weekly for vaginal dryness and urgency symptoms.  She ran out and now has return of significant symptoms and wants to restart her vaginal estrogen cream. I reviewed with her the risks to include absorption with systemic effects such as stroke heart attack DVT breast cancer endometrial stimulation. She is comfortable continuing I refilled her 1 year. Patient knows to call if she does any vaginal bleeding. 2. Osteopenia. DEXA 03/2013  T score -2.2 FRAX 5%/0.3%. Repeat DEXA this coming year after February in order was placed and she'll follow up for this. Vitamin D level 55 last year. 3. Mammography 04/2013. Patient knows she is overdue and agrees to call and schedule. SBE monthly reviewed. 4. Colonoscopy 2016. Repeat at their recommended interval. 5. Pap smear 2012. Pap smear done today. No history of significant abnormal Pap smears previously. 6. Health maintenance. No routine lab work done as this is done at her primary physician's office. Follow up for her bone density otherwise 1 year, sooner as needed.   Anastasio Auerbach MD, 9:27 AM 02/05/2015

## 2015-02-06 LAB — CYTOLOGY - PAP

## 2015-06-11 DIAGNOSIS — S01112D Laceration without foreign body of left eyelid and periocular area, subsequent encounter: Secondary | ICD-10-CM | POA: Diagnosis not present

## 2015-06-11 DIAGNOSIS — Z23 Encounter for immunization: Secondary | ICD-10-CM | POA: Diagnosis not present

## 2015-06-17 MED FILL — ESTRACE 0.01% CREAM: 0.1 | 90 days supply | Qty: 43 | Fill #1

## 2015-10-20 MED FILL — ESTRACE 0.01% CREAM: 0.1 | 90 days supply | Qty: 43 | Fill #2

## 2015-10-30 DIAGNOSIS — D3101 Benign neoplasm of right conjunctiva: Secondary | ICD-10-CM | POA: Diagnosis not present

## 2015-10-30 DIAGNOSIS — H5203 Hypermetropia, bilateral: Secondary | ICD-10-CM | POA: Diagnosis not present

## 2016-01-27 MED FILL — ESTRACE 0.01% CREAM: 0.1 | 90 days supply | Qty: 43 | Fill #3

## 2016-02-11 ENCOUNTER — Ambulatory Visit (INDEPENDENT_AMBULATORY_CARE_PROVIDER_SITE_OTHER): Payer: 59 | Admitting: Gynecology

## 2016-02-11 ENCOUNTER — Encounter: Payer: Self-pay | Admitting: Gynecology

## 2016-02-11 ENCOUNTER — Telehealth: Payer: Self-pay | Admitting: *Deleted

## 2016-02-11 VITALS — BP 114/70 | Ht 65.5 in | Wt 111.0 lb

## 2016-02-11 DIAGNOSIS — M8580 Other specified disorders of bone density and structure, unspecified site: Secondary | ICD-10-CM

## 2016-02-11 DIAGNOSIS — Z13220 Encounter for screening for lipoid disorders: Secondary | ICD-10-CM | POA: Diagnosis not present

## 2016-02-11 DIAGNOSIS — Z01411 Encounter for gynecological examination (general) (routine) with abnormal findings: Secondary | ICD-10-CM

## 2016-02-11 DIAGNOSIS — N952 Postmenopausal atrophic vaginitis: Secondary | ICD-10-CM

## 2016-02-11 LAB — COMPREHENSIVE METABOLIC PANEL
ALT: 18 U/L (ref 6–29)
AST: 24 U/L (ref 10–35)
Albumin: 4 g/dL (ref 3.6–5.1)
Alkaline Phosphatase: 68 U/L (ref 33–130)
BUN: 12 mg/dL (ref 7–25)
CO2: 28 mmol/L (ref 20–31)
Calcium: 8.7 mg/dL (ref 8.6–10.4)
Chloride: 99 mmol/L (ref 98–110)
Creat: 0.65 mg/dL (ref 0.50–1.05)
Glucose, Bld: 80 mg/dL (ref 65–99)
Potassium: 3.9 mmol/L (ref 3.5–5.3)
Sodium: 138 mmol/L (ref 135–146)
Total Bilirubin: 0.5 mg/dL (ref 0.2–1.2)
Total Protein: 7 g/dL (ref 6.1–8.1)

## 2016-02-11 LAB — URINALYSIS W MICROSCOPIC + REFLEX CULTURE
Bacteria, UA: NONE SEEN [HPF]
Bilirubin Urine: NEGATIVE
Casts: NONE SEEN [LPF]
Crystals: NONE SEEN [HPF]
Glucose, UA: NEGATIVE
Hgb urine dipstick: NEGATIVE
Ketones, ur: NEGATIVE
Leukocytes, UA: NEGATIVE
Nitrite: NEGATIVE
Protein, ur: NEGATIVE
RBC / HPF: NONE SEEN RBC/HPF (ref <=2)
Specific Gravity, Urine: 1.006 (ref 1.001–1.035)
WBC, UA: NONE SEEN WBC/HPF (ref <=5)
Yeast: NONE SEEN [HPF]
pH: 7.5 (ref 5.0–8.0)

## 2016-02-11 LAB — CBC WITH DIFFERENTIAL/PLATELET
Basophils Absolute: 64 cells/uL (ref 0–200)
Basophils Relative: 1 %
Eosinophils Absolute: 384 cells/uL (ref 15–500)
Eosinophils Relative: 6 %
HCT: 42.9 % (ref 35.0–45.0)
Hemoglobin: 13.8 g/dL (ref 11.7–15.5)
Lymphocytes Relative: 31 %
Lymphs Abs: 1984 cells/uL (ref 850–3900)
MCH: 29.6 pg (ref 27.0–33.0)
MCHC: 32.2 g/dL (ref 32.0–36.0)
MCV: 91.9 fL (ref 80.0–100.0)
MPV: 9.9 fL (ref 7.5–12.5)
Monocytes Absolute: 576 cells/uL (ref 200–950)
Monocytes Relative: 9 %
Neutro Abs: 3392 cells/uL (ref 1500–7800)
Neutrophils Relative %: 53 %
Platelets: 251 10*3/uL (ref 140–400)
RBC: 4.67 MIL/uL (ref 3.80–5.10)
RDW: 12.9 % (ref 11.0–15.0)
WBC: 6.4 10*3/uL (ref 3.8–10.8)

## 2016-02-11 LAB — LIPID PANEL
Cholesterol: 202 mg/dL — ABNORMAL HIGH (ref <200)
HDL: 82 mg/dL (ref >50)
LDL Cholesterol: 111 mg/dL — ABNORMAL HIGH (ref <100)
Total CHOL/HDL Ratio: 2.5 Ratio (ref <5.0)
Triglycerides: 45 mg/dL (ref <150)
VLDL: 9 mg/dL (ref <30)

## 2016-02-11 LAB — TSH: TSH: 2.09 mIU/L

## 2016-02-11 MED ORDER — NONFORMULARY OR COMPOUNDED ITEM
3 refills | Status: DC
Start: 2016-02-11 — End: 2017-05-12

## 2016-02-11 NOTE — Patient Instructions (Addendum)
Start on the formulated estrogen vaginal cream [facility]. Call me if you have any issues with this.  Follow up for bone density as scheduled.  Schedule your mammogram

## 2016-02-11 NOTE — Telephone Encounter (Signed)
Rx sent 

## 2016-02-11 NOTE — Telephone Encounter (Signed)
-----   Message from Anastasio Auerbach, MD sent at 02/11/2016  9:35 AM EST ----- [facility] into [facility] formulated vaginal estradiol cream prefilled syringes twice weekly three-month supply refill 1 year

## 2016-02-11 NOTE — Progress Notes (Signed)
    Margaret Dougherty 1955/03/29 [id number]        60 y.o.  [id number] for annual exam.    Past medical history,surgical history, problem list, medications, allergies, family history and social history were all reviewed and documented as reviewed in the EPIC chart.  ROS:  Performed with pertinent positives and negatives included in the history, assessment and plan.   Additional significant findings :  None   Exam: Caryn Bee assistant Vitals:   02/11/16 0906  BP: 114/70  Weight: 111 lb (50.3 kg)  Height: 5' 5.5" (1.664 m)   Body mass index is 18.19 kg/m.  General appearance:  Normal affect, orientation and appearance. Skin: Grossly normal HEENT: Without gross lesions.  No cervical or supraclavicular adenopathy. Thyroid normal.  Lungs:  Clear without wheezing, rales or rhonchi Cardiac: RR, without RMG Abdominal:  Soft, nontender, without masses, guarding, rebound, organomegaly or hernia Breasts:  Examined lying and sitting without masses, retractions, discharge or axillary adenopathy. Pelvic:  Ext, BUS, Vagina with atrophic changes  Cervix with atrophic changes  Uterus anteverted, normal size, shape and contour, midline and mobile nontender   Adnexa without masses or tenderness    Anus and perineum normal   Rectovaginal normal sphincter tone without palpated masses or tenderness.    Assessment/Plan:  60 y.o. [id number] female for annual exam.   1. Post menopausal/atrophic genital changes. Is using Estrace vaginal cream twice weekly with good results for vaginal dryness and urgency symptoms. Relates that it's very expensive.  Reviewed alternatives to include estradiol [facility] cream [facility]. Patient wants to go ahead and switch over. Refill 1 year provided. We discussed the issues of absorption and systemic risks to include stroke heart attack DVT breast cancer and endometrial stimulation. Patient knows to report any vaginal bleeding. 2. Osteopenia. DEXA  03/2013 T score -2.2 FRAX 5%/0.3%. Was to schedule DEXA last year but did not do so. Recommended DEXA now she agrees to schedule follow up for this. Check vitamin D level today. 3. Mammography 2015. Patient overdue and I reminded her of this. Needs to schedule now and she agrees to do so. SBE monthly reviewed. 4. Pap smear 2016. No Pap smear done today. No history of significant abnormal Pap smears. 5. Colonoscopy 2016. Repeat at their recommended interval. 6. Health maintenance. Patient requests routine lab work. CBC, CMP, lipid profile, urinalysis, TSH and vitamin D ordered. Follow up in one year, sooner as needed.  Anastasio Auerbach MD, 9:37 AM 02/11/2016

## 2016-02-12 ENCOUNTER — Encounter: Payer: Self-pay | Admitting: Gynecology

## 2016-02-12 LAB — VITAMIN D 25 HYDROXY (VIT D DEFICIENCY, FRACTURES): Vit D, 25-Hydroxy: 30 ng/mL (ref 30–100)

## 2016-05-22 DIAGNOSIS — M810 Age-related osteoporosis without current pathological fracture: Secondary | ICD-10-CM

## 2016-05-22 HISTORY — DX: Age-related osteoporosis without current pathological fracture: M81.0

## 2016-06-02 ENCOUNTER — Ambulatory Visit (INDEPENDENT_AMBULATORY_CARE_PROVIDER_SITE_OTHER): Payer: 59

## 2016-06-02 ENCOUNTER — Other Ambulatory Visit: Payer: Self-pay | Admitting: Gynecology

## 2016-06-02 DIAGNOSIS — M810 Age-related osteoporosis without current pathological fracture: Secondary | ICD-10-CM

## 2016-06-02 DIAGNOSIS — Z13820 Encounter for screening for osteoporosis: Secondary | ICD-10-CM

## 2016-06-02 DIAGNOSIS — M8580 Other specified disorders of bone density and structure, unspecified site: Secondary | ICD-10-CM

## 2016-06-03 ENCOUNTER — Encounter: Payer: Self-pay | Admitting: Gynecology

## 2016-06-03 ENCOUNTER — Telehealth: Payer: Self-pay | Admitting: Gynecology

## 2016-06-03 NOTE — Telephone Encounter (Signed)
Pt informed, will have front desk call to schedule.  

## 2016-06-03 NOTE — Telephone Encounter (Signed)
Tell patient her most recent bone density does show osteoporosis. Recommend office visit to discuss treatment options.

## 2016-06-07 ENCOUNTER — Other Ambulatory Visit: Payer: Self-pay | Admitting: Gynecology

## 2016-06-07 DIAGNOSIS — M810 Age-related osteoporosis without current pathological fracture: Secondary | ICD-10-CM

## 2016-06-22 ENCOUNTER — Encounter: Payer: Self-pay | Admitting: Gynecology

## 2016-06-22 ENCOUNTER — Ambulatory Visit (INDEPENDENT_AMBULATORY_CARE_PROVIDER_SITE_OTHER): Payer: 59 | Admitting: Gynecology

## 2016-06-22 VITALS — BP 118/76

## 2016-06-22 DIAGNOSIS — M810 Age-related osteoporosis without current pathological fracture: Secondary | ICD-10-CM | POA: Diagnosis not present

## 2016-06-22 MED ORDER — ALENDRONATE SODIUM 70 MG PO TABS: 70.0000 mg | ORAL_TABLET | ORAL | 11 refills | Status: DC

## 2016-06-22 MED FILL — ALENDRONATE NA 70 MG TAB: 70 | 28 days supply | Qty: 4 | Fill #0

## 2016-06-22 NOTE — Progress Notes (Signed)
    Margaret Dougherty March 31, 1955 [id number]        61 y.o.  [id number] presents to discuss her most recent DEXA which shows T score -2.7 AP spine with a 7%, 12% and 17% bone loss at measured sites from her prior 2015 DEXA. Normal Z score She is active as far as walking. She most recently had a vitamin D level of 30 and is taking 1000 units of vitamin D daily. Does not supplement calcium. Normal TSH.  Past medical history,surgical history, problem list, medications, allergies, family history and social history were all reviewed and documented in the EPIC chart.  Directed ROS with pertinent positives and negatives documented in the history of present illness/assessment and plan.  Exam: Vitals:   06/22/16 0933  BP: 118/76   General appearance:  Normal   Assessment/Plan:  61 y.o. [id number] with osteoporosis.  Thin, Caucasian without other risk factors. Is supplementing 1000 units vitamin D and I suggested increasing to 2000 units and rechecking vitamin D level in 3 months. Maintaining active weightbearing exercise on a regular basis. Treatment options to include bisphosphonates, Prolia, Evista, Forteo all discussed. The risks versus benefits of each choice reviewed. Benefits of stopping ongoing bone loss and hopefully replacing at least a small percentage also discussed. Risks to include GERD, osteonecrosis of the jaw, atypical fractures, rashes and increased infection rates all discussed. After a lengthy discussion the patient elects to start alendronate 70 mg weekly. I reviewed how to take the medication and the need to call me if she has any perceived side effects. We will plan on repeating her vitamin D in 3 months and her bone density in 2 years.  Greater than 50% of my time was spent in direct face to face counseling and coordination of care with the patient.     Anastasio Auerbach MD, 10:17 AM 06/22/2016

## 2016-06-22 NOTE — Patient Instructions (Signed)
Alendronate tablets  What is this medicine?  ALENDRONATE (a LEN droe nate) slows calcium loss from bones. It helps to make normal healthy bone and to slow bone loss in people with Paget's disease and osteoporosis. It may be used in others at risk for bone loss.  This medicine may be used for other purposes; ask your health care provider or pharmacist if you have questions.  COMMON BRAND NAME(S): Fosamax  What should I tell my health care provider before I take this medicine?  They need to know if you have any of these conditions:  -dental disease  -esophagus, stomach, or intestine problems, like acid reflux or GERD  -kidney disease  -low blood calcium  -low vitamin D  -problems sitting or standing 30 minutes  -trouble swallowing  -an unusual or allergic reaction to alendronate, other medicines, foods, dyes, or preservatives  -pregnant or trying to get pregnant  -breast-feeding  How should I use this medicine?  You must take this medicine exactly as directed or you will lower the amount of the medicine you absorb into your body or you may cause yourself harm. Take this medicine by mouth first thing in the morning, after you are up for the day. Do not eat or drink anything before you take your medicine. Swallow the tablet with a full glass (6 to 8 fluid ounces) of plain water. Do not take this medicine with any other drink. Do not chew or crush the tablet. After taking this medicine, do not eat breakfast, drink, or take any medicines or vitamins for at least 30 minutes. Sit or stand up for at least 30 minutes after you take this medicine; do not lie down. Do not take your medicine more often than directed.  Talk to your pediatrician regarding the use of this medicine in children. Special care may be needed.  Overdosage: If you think you have taken too much of this medicine contact a poison control center or emergency room at once.  NOTE: This medicine is only for you. Do not share this medicine with others.  What if I  miss a dose?  If you miss a dose, do not take it later in the day. Continue your normal schedule starting the next morning. Do not take double or extra doses.  What may interact with this medicine?  -aluminum hydroxide  -antacids  -aspirin  -calcium supplements  -drugs for inflammation like ibuprofen, naproxen, and others  -iron supplements  -magnesium supplements  -vitamins with minerals  This list may not describe all possible interactions. Give your health care provider a list of all the medicines, herbs, non-prescription drugs, or dietary supplements you use. Also tell them if you smoke, drink alcohol, or use illegal drugs. Some items may interact with your medicine.  What should I watch for while using this medicine?  Visit your doctor or health care professional for regular checks ups. It may be some time before you see benefit from this medicine. Do not stop taking your medicine except on your doctor's advice. Your doctor or health care professional may order blood tests and other tests to see how you are doing.  You should make sure you get enough calcium and vitamin D while you are taking this medicine, unless your doctor tells you not to. Discuss the foods you eat and the vitamins you take with your health care professional.  Some people who take this medicine have severe bone, joint, and/or muscle pain. This medicine may also increase your risk   for a broken thigh bone. Tell your doctor right away if you have pain in your upper leg or groin. Tell your doctor if you have any pain that does not go away or that gets worse.  This medicine can make you more sensitive to the sun. If you get a rash while taking this medicine, sunlight may cause the rash to get worse. Keep out of the sun. If you cannot avoid being in the sun, wear protective clothing and use sunscreen. Do not use sun lamps or tanning beds/booths.  What side effects may I notice from receiving this medicine?  Side effects that you should report to  your doctor or health care professional as soon as possible:  -allergic reactions like skin rash, itching or hives, swelling of the face, lips, or tongue  -black or tarry stools  -bone, muscle or joint pain  -changes in vision  -chest pain  -heartburn or stomach pain  -jaw pain, especially after dental work  -pain or trouble when swallowing  -redness, blistering, peeling or loosening of the skin, including inside the mouth  Side effects that usually do not require medical attention (report to your doctor or health care professional if they continue or are bothersome):  -changes in taste  -diarrhea or constipation  -eye pain or itching  -headache  -nausea or vomiting  -stomach gas or fullness  This list may not describe all possible side effects. Call your doctor for medical advice about side effects. You may report side effects to FDA at 1-800-FDA-1088.  Where should I keep my medicine?  Keep out of the reach of children.  Store at room temperature of 15 and 30 degrees C (59 and 86 degrees F). Throw away any unused medicine after the expiration date.  NOTE: This sheet is a summary. It may not cover all possible information. If you have questions about this medicine, talk to your doctor, pharmacist, or health care provider.   2018 Elsevier/Gold Standard (2010-08-06 08:56:09)

## 2016-07-14 MED FILL — AMOXICILLIN 500 MG CAPSULE: 500 | 10 days supply | Qty: 30 | Fill #0

## 2016-09-16 ENCOUNTER — Other Ambulatory Visit: Payer: Self-pay | Admitting: *Deleted

## 2016-09-16 DIAGNOSIS — E559 Vitamin D deficiency, unspecified: Secondary | ICD-10-CM

## 2016-10-17 ENCOUNTER — Other Ambulatory Visit: Payer: Self-pay | Admitting: Gynecology

## 2016-10-17 DIAGNOSIS — Z1231 Encounter for screening mammogram for malignant neoplasm of breast: Secondary | ICD-10-CM

## 2016-10-28 ENCOUNTER — Ambulatory Visit
Admission: RE | Admit: 2016-10-28 | Discharge: 2016-10-28 | Disposition: A | Discharge status: Home / Self Care | Payer: 59 | Source: Ambulatory Visit | Attending: Gynecology | Admitting: Gynecology

## 2016-10-28 DIAGNOSIS — Z1231 Encounter for screening mammogram for malignant neoplasm of breast: Secondary | ICD-10-CM | POA: Diagnosis not present

## 2016-12-15 DIAGNOSIS — Z23 Encounter for immunization: Secondary | ICD-10-CM | POA: Diagnosis not present

## 2017-01-26 DIAGNOSIS — H5203 Hypermetropia, bilateral: Secondary | ICD-10-CM | POA: Diagnosis not present

## 2017-01-26 DIAGNOSIS — H9202 Otalgia, left ear: Secondary | ICD-10-CM | POA: Diagnosis not present

## 2017-01-26 DIAGNOSIS — H52223 Regular astigmatism, bilateral: Secondary | ICD-10-CM | POA: Diagnosis not present

## 2017-01-26 DIAGNOSIS — H938X2 Other specified disorders of left ear: Secondary | ICD-10-CM | POA: Diagnosis not present

## 2017-01-26 DIAGNOSIS — H524 Presbyopia: Secondary | ICD-10-CM | POA: Diagnosis not present

## 2017-01-26 DIAGNOSIS — H6122 Impacted cerumen, left ear: Secondary | ICD-10-CM | POA: Diagnosis not present

## 2017-02-16 ENCOUNTER — Encounter: Payer: 59 | Admitting: Gynecology

## 2017-05-08 ENCOUNTER — Encounter: Payer: Self-pay | Admitting: Gynecology

## 2017-05-08 ENCOUNTER — Ambulatory Visit (INDEPENDENT_AMBULATORY_CARE_PROVIDER_SITE_OTHER): Payer: 59 | Admitting: Gynecology

## 2017-05-08 VITALS — BP 114/70 | Ht 65.0 in | Wt 120.0 lb

## 2017-05-08 DIAGNOSIS — Z01411 Encounter for gynecological examination (general) (routine) with abnormal findings: Secondary | ICD-10-CM

## 2017-05-08 DIAGNOSIS — Z13220 Encounter for screening for lipoid disorders: Secondary | ICD-10-CM

## 2017-05-08 DIAGNOSIS — N952 Postmenopausal atrophic vaginitis: Secondary | ICD-10-CM

## 2017-05-08 DIAGNOSIS — Z1151 Encounter for screening for human papillomavirus (HPV): Secondary | ICD-10-CM | POA: Diagnosis not present

## 2017-05-08 DIAGNOSIS — M810 Age-related osteoporosis without current pathological fracture: Secondary | ICD-10-CM

## 2017-05-08 NOTE — Addendum Note (Signed)
Addended by: Nelva Nay on: 05/08/2017 10:42 AM   Modules accepted: Orders

## 2017-05-08 NOTE — Patient Instructions (Signed)
Follow-up in 1 year for annual exam.  We will repeat your bone density at that time also.

## 2017-05-08 NOTE — Progress Notes (Addendum)
    Margaret Dougherty 08/23/1955 [id number]        61 y.o.  [id number] for annual gynecologic exam.  Doing well without complaints.  Several issues noted below.  Past medical history,surgical history, problem list, medications, allergies, family history and social history were all reviewed and documented as reviewed in the EPIC chart.  ROS:  Performed with pertinent positives and negatives included in the history, assessment and plan.   Additional significant findings : None   Exam: Margaret Dougherty assistant Vitals:   05/08/17 0845  BP: 114/70  Weight: 120 lb (54.4 kg)  Height: 5\' 5"  (1.651 m)   Body mass index is 19.97 kg/m.  General appearance:  Normal affect, orientation and appearance. Skin: Grossly normal HEENT: Without gross lesions.  No cervical or supraclavicular adenopathy. Thyroid normal.  Lungs:  Clear without wheezing, rales or rhonchi Cardiac: RR, without RMG Abdominal:  Soft, nontender, without masses, guarding, rebound, organomegaly or hernia Breasts:  Examined lying and sitting without masses, retractions, discharge or axillary adenopathy. Pelvic:  Ext, BUS, Vagina: With atrophic changes  Cervix: With atrophic changes.  Pap smear/HPV  Uterus: Anteverted, normal size, shape and contour, midline and mobile nontender   Adnexa: Without masses or tenderness    Anus and perineum: Normal   Rectovaginal: Normal sphincter tone without palpated masses or tenderness.    Assessment/Plan:  62 y.o. [name] female for annual gynecologic exam.   1. Postmenopausal/atrophic genital changes.  Without significant hot flushes, night sweats, vaginal dryness or any vaginal bleeding.  Report any issues or bleeding.  Continues on vaginal cream for atrophic changes and refill will be provided through Margaret Dougherty.   2. Osteoporosis.  DEXA 2018 T score -2.7.  We discussed treatment options last year and she started on alendronate.  She took several doses and had GI upset and stopped  it.  She is currently starting a program called [employer] which includes weight exercises.  I reviewed with the patient the diagnosis of osteoporosis and her increased risk of fracture.  Benefits of medication to not only arrest ongoing loss but to also replace some bone.  Unproven fracture reduction/ongoing bone loss for OTC products/programs.  At this point the patient would prefer staying off of medication but continuing with her new program.  Importance of calcium intake as well as vitamin D also discussed.  Will check baseline vitamin D level now.  She understands and accepts the possibilities of ongoing bone loss.  We will plan on repeating her bone density next year at a 2-year interval. 3. Mammography 10/2016.  Continue with annual mammography when due.  Breast exam normal today. 4. Pap smear 2016.  Pap smear/HPV today.  No history of abnormal Pap smears previously. 5. Colonoscopy 2016.  Plan repeat at their recommended interval. 6. Health maintenance.  Future orders for fasting CBC, CMP, lipid profile, TSH and vitamin D placed.  Check urine analysis today.  Follow-up in 1 year for annual exam and repeat bone density.   Margaret Auerbach MD, 9:26 AM 05/08/2017

## 2017-05-11 LAB — PAP IG AND HPV HIGH-RISK: HPV DNA High Risk: NOT DETECTED

## 2017-05-12 ENCOUNTER — Other Ambulatory Visit: Payer: Self-pay

## 2017-05-12 ENCOUNTER — Other Ambulatory Visit: Payer: 59

## 2017-05-12 DIAGNOSIS — Z01411 Encounter for gynecological examination (general) (routine) with abnormal findings: Secondary | ICD-10-CM

## 2017-05-12 DIAGNOSIS — Z13220 Encounter for screening for lipoid disorders: Secondary | ICD-10-CM | POA: Diagnosis not present

## 2017-05-12 DIAGNOSIS — M810 Age-related osteoporosis without current pathological fracture: Secondary | ICD-10-CM | POA: Diagnosis not present

## 2017-05-12 NOTE — Telephone Encounter (Signed)
Okay to refill estradiol vaginal cream twice weekly times 1 year

## 2017-05-13 LAB — CBC WITH DIFFERENTIAL/PLATELET
Basophils Absolute: 51 cells/uL (ref 0–200)
Basophils Relative: 1 %
Eosinophils Absolute: 168 cells/uL (ref 15–500)
Eosinophils Relative: 3.3 %
HCT: 39.6 % (ref 35.0–45.0)
Hemoglobin: 13.7 g/dL (ref 11.7–15.5)
Lymphs Abs: 2443 cells/uL (ref 850–3900)
MCH: 30 pg (ref 27.0–33.0)
MCHC: 34.6 g/dL (ref 32.0–36.0)
MCV: 86.7 fL (ref 80.0–100.0)
MPV: 10.2 fL (ref 7.5–12.5)
Monocytes Relative: 8.8 %
Neutro Abs: 1989 cells/uL (ref 1500–7800)
Neutrophils Relative %: 39 %
Platelets: 241 10*3/uL (ref 140–400)
RBC: 4.57 10*6/uL (ref 3.80–5.10)
RDW: 12.1 % (ref 11.0–15.0)
Total Lymphocyte: 47.9 %
WBC mixed population: 449 cells/uL (ref 200–950)
WBC: 5.1 10*3/uL (ref 3.8–10.8)

## 2017-05-13 LAB — COMPREHENSIVE METABOLIC PANEL
AG Ratio: 1.5 (calc) (ref 1.0–2.5)
ALT: 16 U/L (ref 6–29)
AST: 23 U/L (ref 10–35)
Albumin: 4.1 g/dL (ref 3.6–5.1)
Alkaline phosphatase (APISO): 74 U/L (ref 33–130)
BUN: 14 mg/dL (ref 7–25)
CO2: 31 mmol/L (ref 20–32)
Calcium: 9.4 mg/dL (ref 8.6–10.4)
Chloride: 104 mmol/L (ref 98–110)
Creat: 0.71 mg/dL (ref 0.50–0.99)
Globulin: 2.8 g/dL (calc) (ref 1.9–3.7)
Glucose, Bld: 88 mg/dL (ref 65–99)
Potassium: 4.3 mmol/L (ref 3.5–5.3)
Sodium: 139 mmol/L (ref 135–146)
Total Bilirubin: 0.6 mg/dL (ref 0.2–1.2)
Total Protein: 6.9 g/dL (ref 6.1–8.1)

## 2017-05-13 LAB — LIPID PANEL
Cholesterol: 208 mg/dL — ABNORMAL HIGH (ref <200)
HDL: 72 mg/dL (ref >50)
LDL Cholesterol (Calc): 122 mg/dL (calc) — ABNORMAL HIGH
Non-HDL Cholesterol (Calc): 136 mg/dL (calc) — ABNORMAL HIGH (ref <130)
Total CHOL/HDL Ratio: 2.9 (calc) (ref <5.0)
Triglycerides: 46 mg/dL (ref <150)

## 2017-05-13 LAB — URINALYSIS, COMPLETE W/RFL CULTURE
Bilirubin Urine: NEGATIVE
Glucose, UA: NEGATIVE
Hgb urine dipstick: NEGATIVE
Hyaline Cast: NONE SEEN /LPF
Ketones, ur: NEGATIVE
Nitrites, Initial: NEGATIVE
Protein, ur: NEGATIVE
Specific Gravity, Urine: 1.017 (ref 1.001–1.03)
pH: 5.5 (ref 5.0–8.0)

## 2017-05-13 LAB — URINE CULTURE
MICRO NUMBER:: 90338292
SPECIMEN QUALITY:: ADEQUATE

## 2017-05-13 LAB — TSH: TSH: 2.38 mIU/L (ref 0.40–4.50)

## 2017-05-13 LAB — CULTURE INDICATED

## 2017-05-13 LAB — VITAMIN D 25 HYDROXY (VIT D DEFICIENCY, FRACTURES): Vit D, 25-Hydroxy: 47 ng/mL (ref 30–100)

## 2017-05-15 ENCOUNTER — Encounter: Payer: Self-pay | Admitting: Gynecology

## 2017-05-15 MED ORDER — NONFORMULARY OR COMPOUNDED ITEM: 3 refills | Status: DC

## 2017-05-15 NOTE — Telephone Encounter (Signed)
Okay to refill patient's estradiol vaginal cream prescription at [facility]

## 2017-05-15 NOTE — Telephone Encounter (Signed)
Rx sent 

## 2017-05-15 NOTE — Telephone Encounter (Signed)
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

## 2017-05-18 ENCOUNTER — Other Ambulatory Visit: Payer: Self-pay

## 2017-05-18 MED ORDER — NONFORMULARY OR COMPOUNDED ITEM: 3 refills | Status: DC

## 2017-06-06 ENCOUNTER — Other Ambulatory Visit: Payer: Self-pay | Admitting: *Deleted

## 2017-06-06 MED ORDER — CIPROFLOXACIN HCL 250 MG PO TABS: 250.0000 mg | ORAL_TABLET | Freq: Two times a day (BID) | ORAL | 0 refills | Status: DC

## 2017-11-14 DIAGNOSIS — Z23 Encounter for immunization: Secondary | ICD-10-CM | POA: Diagnosis not present

## 2017-11-28 ENCOUNTER — Other Ambulatory Visit: Payer: Self-pay | Admitting: Gynecology

## 2017-11-28 DIAGNOSIS — Z1231 Encounter for screening mammogram for malignant neoplasm of breast: Secondary | ICD-10-CM

## 2018-01-03 ENCOUNTER — Ambulatory Visit
Admission: RE | Admit: 2018-01-03 | Discharge: 2018-01-03 | Disposition: A | Discharge status: Home / Self Care | Payer: 59 | Source: Ambulatory Visit | Attending: Gynecology | Admitting: Gynecology

## 2018-01-03 DIAGNOSIS — Z1231 Encounter for screening mammogram for malignant neoplasm of breast: Secondary | ICD-10-CM | POA: Diagnosis not present

## 2018-05-10 ENCOUNTER — Encounter: Payer: 59 | Admitting: Gynecology

## 2018-05-22 ENCOUNTER — Telehealth: Payer: Self-pay | Admitting: *Deleted

## 2018-05-22 MED ORDER — NONFORMULARY OR COMPOUNDED ITEM
0 refills | Status: AC
Start: 2018-05-22 — End: ?

## 2018-05-22 NOTE — Telephone Encounter (Signed)
Patient called requesting refill on compound estradiol cream 0.02%, canceled annual exam due to COVID-19 recommendations.  Patient will call back to reschedule.

## 2018-08-03 DIAGNOSIS — E559 Vitamin D deficiency, unspecified: Secondary | ICD-10-CM | POA: Diagnosis not present

## 2018-08-03 DIAGNOSIS — Z0000 Encounter for general adult medical examination without abnormal findings: Secondary | ICD-10-CM | POA: Diagnosis not present

## 2018-08-03 DIAGNOSIS — E7800 Pure hypercholesterolemia, unspecified: Secondary | ICD-10-CM | POA: Diagnosis not present

## 2018-08-07 DIAGNOSIS — Z0000 Encounter for general adult medical examination without abnormal findings: Secondary | ICD-10-CM | POA: Diagnosis not present

## 2018-08-15 DIAGNOSIS — Z20828 Contact with and (suspected) exposure to other viral communicable diseases: Secondary | ICD-10-CM | POA: Diagnosis not present

## 2018-08-24 DIAGNOSIS — Z20828 Contact with and (suspected) exposure to other viral communicable diseases: Secondary | ICD-10-CM | POA: Diagnosis not present

## 2018-09-04 ENCOUNTER — Telehealth: Payer: Self-pay | Admitting: Internal Medicine

## 2018-09-04 NOTE — Telephone Encounter (Signed)
Received page that patient called in with BP 190/110. Unclear if patient was having an associated symptoms from the text page. Attempted to call back Margaret Dougherty three times over the span of 1.5 hours, however reached patient's voicemail each time.

## 2018-10-16 DIAGNOSIS — Z20828 Contact with and (suspected) exposure to other viral communicable diseases: Secondary | ICD-10-CM | POA: Diagnosis not present

## 2018-11-07 DIAGNOSIS — R399 Unspecified symptoms and signs involving the genitourinary system: Secondary | ICD-10-CM | POA: Diagnosis not present

## 2018-11-20 ENCOUNTER — Encounter: Payer: Self-pay | Admitting: Gynecology

## 2018-12-17 ENCOUNTER — Other Ambulatory Visit: Payer: Self-pay | Admitting: Gynecology

## 2018-12-17 DIAGNOSIS — Z1231 Encounter for screening mammogram for malignant neoplasm of breast: Secondary | ICD-10-CM

## 2019-01-29 DIAGNOSIS — Z20828 Contact with and (suspected) exposure to other viral communicable diseases: Secondary | ICD-10-CM | POA: Diagnosis not present

## 2019-02-05 ENCOUNTER — Other Ambulatory Visit: Payer: Self-pay

## 2019-02-05 ENCOUNTER — Ambulatory Visit
Admission: RE | Admit: 2019-02-05 | Discharge: 2019-02-05 | Disposition: A | Discharge status: Home / Self Care | Payer: BC Managed Care – PPO | Source: Ambulatory Visit | Attending: Gynecology | Admitting: Gynecology

## 2019-02-05 DIAGNOSIS — Z1231 Encounter for screening mammogram for malignant neoplasm of breast: Secondary | ICD-10-CM

## 2019-02-07 ENCOUNTER — Other Ambulatory Visit: Payer: Self-pay | Admitting: Gynecology

## 2019-02-07 DIAGNOSIS — R928 Other abnormal and inconclusive findings on diagnostic imaging of breast: Secondary | ICD-10-CM

## 2019-02-12 ENCOUNTER — Ambulatory Visit: Payer: BC Managed Care – PPO | Attending: Internal Medicine

## 2019-02-12 DIAGNOSIS — Z20822 Contact with and (suspected) exposure to covid-19: Secondary | ICD-10-CM

## 2019-02-12 DIAGNOSIS — Z20828 Contact with and (suspected) exposure to other viral communicable diseases: Secondary | ICD-10-CM | POA: Diagnosis not present

## 2019-02-13 LAB — NOVEL CORONAVIRUS, NAA: SARS-CoV-2, NAA: NOT DETECTED

## 2019-02-18 ENCOUNTER — Ambulatory Visit
Admission: RE | Admit: 2019-02-18 | Discharge: 2019-02-18 | Disposition: A | Discharge status: Home / Self Care | Payer: BC Managed Care – PPO | Source: Ambulatory Visit | Attending: Gynecology | Admitting: Gynecology

## 2019-02-18 ENCOUNTER — Other Ambulatory Visit: Payer: Self-pay

## 2019-02-18 DIAGNOSIS — R928 Other abnormal and inconclusive findings on diagnostic imaging of breast: Secondary | ICD-10-CM

## 2019-02-27 ENCOUNTER — Ambulatory Visit: Payer: 59 | Attending: Internal Medicine

## 2019-02-27 DIAGNOSIS — Z20822 Contact with and (suspected) exposure to covid-19: Secondary | ICD-10-CM

## 2019-02-28 LAB — NOVEL CORONAVIRUS, NAA: SARS-CoV-2, NAA: NOT DETECTED

## 2019-03-07 ENCOUNTER — Ambulatory Visit: Payer: 59 | Attending: Internal Medicine

## 2019-03-07 DIAGNOSIS — Z20822 Contact with and (suspected) exposure to covid-19: Secondary | ICD-10-CM

## 2019-03-08 LAB — NOVEL CORONAVIRUS, NAA: SARS-CoV-2, NAA: NOT DETECTED

## 2019-03-14 ENCOUNTER — Ambulatory Visit: Payer: 59 | Attending: Internal Medicine

## 2019-03-14 DIAGNOSIS — Z20822 Contact with and (suspected) exposure to covid-19: Secondary | ICD-10-CM

## 2019-03-15 LAB — NOVEL CORONAVIRUS, NAA: SARS-CoV-2, NAA: NOT DETECTED

## 2019-03-21 ENCOUNTER — Ambulatory Visit: Payer: 59 | Attending: Internal Medicine

## 2019-03-21 DIAGNOSIS — Z20822 Contact with and (suspected) exposure to covid-19: Secondary | ICD-10-CM

## 2019-03-22 LAB — NOVEL CORONAVIRUS, NAA: SARS-CoV-2, NAA: NOT DETECTED

## 2019-03-28 ENCOUNTER — Ambulatory Visit: Payer: 59 | Attending: Internal Medicine

## 2019-03-28 DIAGNOSIS — Z20822 Contact with and (suspected) exposure to covid-19: Secondary | ICD-10-CM

## 2019-03-30 LAB — NOVEL CORONAVIRUS, NAA: SARS-CoV-2, NAA: NOT DETECTED

## 2019-04-01 ENCOUNTER — Ambulatory Visit: Payer: 59 | Attending: Internal Medicine

## 2019-04-01 DIAGNOSIS — Z23 Encounter for immunization: Secondary | ICD-10-CM | POA: Insufficient documentation

## 2019-04-01 NOTE — Progress Notes (Signed)
   Covid-19 Vaccination Clinic  Name:  Margaret Dougherty    MRN: [id number] DOB: September 09, 1955  04/01/2019  [name] was observed post Covid-19 immunization for 15 minutes without incidence. She was provided with Vaccine Information Sheet and instruction to access the V-Safe system.   [name] was instructed to call 911 with any severe reactions post vaccine: Marland Kitchen Difficulty breathing  . Swelling of your face and throat  . A fast heartbeat  . A bad rash all over your body  . Dizziness and weakness    Immunizations Administered    Name Date Dose VIS Date Route   Pfizer COVID-19 Vaccine 04/01/2019  2:32 PM 0.3 mL 02/01/2019 Intramuscular   Manufacturer: [place]   Lot: [id number]   [place]: [id number]

## 2019-04-26 ENCOUNTER — Ambulatory Visit: Payer: 59 | Attending: Internal Medicine

## 2019-04-26 DIAGNOSIS — Z23 Encounter for immunization: Secondary | ICD-10-CM | POA: Insufficient documentation

## 2019-04-26 NOTE — Progress Notes (Signed)
   Covid-19 Vaccination Clinic  Name:  Margaret Dougherty    MRN: [id number] DOB: [birth year]  04/26/2019  [name] was observed post Covid-19 immunization for 15 minutes without incident. She was provided with Vaccine Information Sheet and instruction to access the V-Safe system.   [name] was instructed to call 911 with any severe reactions post vaccine: Marland Kitchen Difficulty breathing  . Swelling of face and throat  . A fast heartbeat  . A bad rash all over body  . Dizziness and weakness   Immunizations Administered    Name Date Dose VIS Date Route   Pfizer COVID-19 Vaccine 04/26/2019  9:23 AM 0.3 mL 02/01/2019 Intramuscular   Manufacturer: [place]   Lot: [id number]   [place]: [id number]

## 2019-09-02 ENCOUNTER — Other Ambulatory Visit: Payer: Self-pay

## 2019-09-23 ENCOUNTER — Encounter: Payer: Self-pay | Admitting: Podiatry

## 2019-09-23 ENCOUNTER — Ambulatory Visit (INDEPENDENT_AMBULATORY_CARE_PROVIDER_SITE_OTHER): Payer: 59 | Admitting: Podiatry

## 2019-09-23 ENCOUNTER — Other Ambulatory Visit: Payer: Self-pay

## 2019-09-23 VITALS — Temp 97.7°F

## 2019-09-23 DIAGNOSIS — M779 Enthesopathy, unspecified: Secondary | ICD-10-CM | POA: Diagnosis not present

## 2019-09-23 DIAGNOSIS — M722 Plantar fascial fibromatosis: Secondary | ICD-10-CM

## 2019-09-23 DIAGNOSIS — M205X1 Other deformities of toe(s) (acquired), right foot: Secondary | ICD-10-CM | POA: Diagnosis not present

## 2019-09-23 NOTE — Progress Notes (Signed)
Subjective:   Patient ID: Margaret Dougherty, female   DOB: 64 y.o.   MRN: [id number]   HPI Patient states she has had a lump in the bottom of her right foot that she thought was there about 5 years ago and went away with medication and is now back and bothering her and also has had a lot of pain in the big toe joint right.  States that that is been there for about 4 months and she does not member injury but it does feel thicker and she knows the motion is diminished   Review of Systems  All other systems reviewed and are negative.       Objective:  Physical Exam Vitals and nursing note reviewed.  Constitutional:      Appearance: She is well-developed.  Pulmonary:     Effort: Pulmonary effort is normal.  Musculoskeletal:        General: Normal range of motion.  Skin:    General: Skin is warm.  Neurological:     Mental Status: She is alert.     Neurovascular status intact muscle strength found to be adequate range of motion within normal limits subtalar midtarsal joint with diminished motion first MPJ right with thickness and spurring around the joint surface.  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  Patient will be seen back to recheck and will have x-rays of both feet at next visit and we can talk more about the bone spur and hallux limitus rigidus condition right and what else may be necessary

## 2019-10-14 ENCOUNTER — Ambulatory Visit: Payer: 59 | Admitting: Podiatry

## 2019-10-24 ENCOUNTER — Ambulatory Visit: Payer: 59 | Admitting: Podiatry

## 2019-12-30 ENCOUNTER — Encounter: Payer: Self-pay | Admitting: Podiatry

## 2019-12-30 ENCOUNTER — Other Ambulatory Visit: Payer: Self-pay

## 2019-12-30 ENCOUNTER — Ambulatory Visit (INDEPENDENT_AMBULATORY_CARE_PROVIDER_SITE_OTHER): Payer: 59

## 2019-12-30 ENCOUNTER — Ambulatory Visit (INDEPENDENT_AMBULATORY_CARE_PROVIDER_SITE_OTHER): Payer: 59 | Admitting: Podiatry

## 2019-12-30 DIAGNOSIS — M79671 Pain in right foot: Secondary | ICD-10-CM

## 2019-12-30 DIAGNOSIS — M205X1 Other deformities of toe(s) (acquired), right foot: Secondary | ICD-10-CM

## 2019-12-30 DIAGNOSIS — M722 Plantar fascial fibromatosis: Secondary | ICD-10-CM | POA: Diagnosis not present

## 2019-12-30 DIAGNOSIS — M79672 Pain in left foot: Secondary | ICD-10-CM

## 2019-12-30 DIAGNOSIS — M205X9 Other deformities of toe(s) (acquired), unspecified foot: Secondary | ICD-10-CM | POA: Diagnosis not present

## 2019-12-31 NOTE — Progress Notes (Signed)
Subjective:   Patient ID: Margaret Dougherty, female   DOB: 64 y.o.   MRN: [id number]   HPI Patient presents stating she did really well with the injections last time and feels like the lesion is getting smaller and not as painful and understands someday she needs surgery and would like to discuss   ROS      Objective:  Physical Exam  Neurovascular status intact with range of motion loss first MPJ right with redness around the first metatarsal head and lesion measuring about 1 cm x 1 cm plantar aspect right arch     Assessment:  Hallux limitus rigidus condition right improved with injection present along with plantar fibroma right     Plan:  H&P reviewed conditions and discussed.  At this point I educated her on hallux limitus considerations for surgery and currently will get a hold on and see how it does.  For the plantar fibroma continue verapamil usage and supportive shoes and will be seen back as needed.  Discussed both conditions at great length

## 2020-01-07 ENCOUNTER — Other Ambulatory Visit: Payer: Self-pay | Admitting: Podiatry

## 2020-01-07 DIAGNOSIS — M205X9 Other deformities of toe(s) (acquired), unspecified foot: Secondary | ICD-10-CM

## 2020-02-10 ENCOUNTER — Other Ambulatory Visit: Payer: 59

## 2020-03-04 ENCOUNTER — Other Ambulatory Visit: Payer: 59

## 2020-03-04 DIAGNOSIS — Z20822 Contact with and (suspected) exposure to covid-19: Secondary | ICD-10-CM

## 2020-03-06 LAB — SARS-COV-2, NAA 2 DAY TAT

## 2020-03-06 LAB — NOVEL CORONAVIRUS, NAA: SARS-CoV-2, NAA: NOT DETECTED

## 2020-04-07 ENCOUNTER — Other Ambulatory Visit: Payer: Self-pay | Admitting: Family Medicine

## 2020-04-07 DIAGNOSIS — Z1231 Encounter for screening mammogram for malignant neoplasm of breast: Secondary | ICD-10-CM

## 2020-04-09 ENCOUNTER — Ambulatory Visit: Payer: 59 | Admitting: Podiatry

## 2020-04-10 ENCOUNTER — Ambulatory Visit
Admission: RE | Admit: 2020-04-10 | Discharge: 2020-04-10 | Disposition: A | Discharge status: Home / Self Care | Payer: 59 | Source: Ambulatory Visit | Attending: Family Medicine | Admitting: Family Medicine

## 2020-04-10 ENCOUNTER — Other Ambulatory Visit: Payer: Self-pay

## 2020-04-10 DIAGNOSIS — Z1231 Encounter for screening mammogram for malignant neoplasm of breast: Secondary | ICD-10-CM

## 2020-04-13 ENCOUNTER — Ambulatory Visit (INDEPENDENT_AMBULATORY_CARE_PROVIDER_SITE_OTHER): Payer: 59 | Admitting: Podiatry

## 2020-04-13 ENCOUNTER — Other Ambulatory Visit: Payer: Self-pay

## 2020-04-13 ENCOUNTER — Encounter: Payer: Self-pay | Admitting: Podiatry

## 2020-04-13 DIAGNOSIS — M205X1 Other deformities of toe(s) (acquired), right foot: Secondary | ICD-10-CM

## 2020-04-13 DIAGNOSIS — M779 Enthesopathy, unspecified: Secondary | ICD-10-CM

## 2020-04-13 MED ORDER — TRIAMCINOLONE ACETONIDE 10 MG/ML IJ SUSP
10.0000 mg | Freq: Once | INTRAMUSCULAR | Status: AC
  Administered 2020-04-13: 10 mg

## 2020-04-13 NOTE — Progress Notes (Signed)
Subjective:   Patient ID: Margaret Dougherty, female   DOB: 65 y.o.   MRN: [id number]   HPI Patient presents stating the big toe joint right has started to hurt again and I had approximate 5 months of relief.  I know I need to get it fixed but in a perfect world I would like to wait till I am on Medicare   ROS      Objective:  Physical Exam  Neurovascular status intact with inflammation of the first MPJ right fluid buildup reduced motion and pain in the joint      Assessment:  Inflammatory capsulitis along with chronic hallux limitus condition with spur formation first MPJ right     Plan:  H&P reviewed condition.  I went ahead did sterile prep and I injected around the joint 3 mg dexamethasone Kenalog 5 mg Xylocaine and I discussed hallux limitus and repair and when we will do this.  We are going to keep an eye on it and at next view will have x-ray and encourage patient to call with questions concerns and understands surgery will be necessary and there is a risk of delaying

## 2020-08-03 ENCOUNTER — Encounter: Payer: Self-pay | Admitting: Podiatry

## 2020-08-03 ENCOUNTER — Ambulatory Visit (INDEPENDENT_AMBULATORY_CARE_PROVIDER_SITE_OTHER): Payer: 59 | Admitting: Podiatry

## 2020-08-03 ENCOUNTER — Ambulatory Visit (INDEPENDENT_AMBULATORY_CARE_PROVIDER_SITE_OTHER): Payer: 59

## 2020-08-03 ENCOUNTER — Other Ambulatory Visit: Payer: Self-pay

## 2020-08-03 DIAGNOSIS — M79671 Pain in right foot: Secondary | ICD-10-CM | POA: Diagnosis not present

## 2020-08-03 DIAGNOSIS — M205X1 Other deformities of toe(s) (acquired), right foot: Secondary | ICD-10-CM

## 2020-08-04 NOTE — Progress Notes (Signed)
Subjective:   Patient ID: Margaret Dougherty, female   DOB: 65 y.o.   MRN: [id number]   HPI Patient presents stating she has had a lot of pain around the big toe joint right and she knows that she needs surgery.  She will get this done as soon as possible and states that she is having trouble walking on the outside of her foot we have tried previous injection anti-inflammatories physical therapy support   ROS      Objective:  Physical Exam  Neurovascular status intact with inflammation pain around the first MPJ right with fluid buildup around the joint surface reduced motion and pain with pressure.  Patient is found to have good digital perfusion well oriented x3     Assessment:  Inflammatory capsulitis hallux limitus deformity first MPJ right with reduced range of motion     Plan:  H&P reviewed condition discussed at great length and at this point surgical intervention has been decided on.  I allowed her to read consent form going over alternative treatments complications and the fact there is no long-term guarantees and ultimate fusion joint implant may be necessary and if I find the cartilage is compromised and may be necessary at the time we do the initial surgery.  Patient understands all of this and at this point after extensive review signed consent form scheduled for outpatient surgery encouraged to call with questions concerns which may arise.  Patient understands total recovery can take 6 months to 1 year and I cannot guarantee I can gain all motion back for her.  Air fracture walker dispensed that I want her to start wearing prior to surgery  X-rays indicate that there is spurring around the first MPJ moderate narrowing of the joint surface with signs of arthritis but does have a reasonable joint space  Patient presents stating

## 2020-08-10 ENCOUNTER — Telehealth: Payer: Self-pay

## 2020-08-10 NOTE — Telephone Encounter (Signed)
DOS 08/25/2020  [facility] - [place] [facility]#[id number] GOOD FOR 08/25/2020 FOR CPT [phone]

## 2020-08-24 MED ORDER — OXYCODONE-ACETAMINOPHEN 10-325 MG PO TABS: 1.0000 | ORAL_TABLET | ORAL | 0 refills | Status: DC | PRN

## 2020-08-24 MED ORDER — ONDANSETRON HCL 4 MG PO TABS: 4.0000 mg | ORAL_TABLET | Freq: Three times a day (TID) | ORAL | 0 refills | Status: AC | PRN

## 2020-08-24 NOTE — Addendum Note (Signed)
Addended by: Wallene Huh on: 08/24/2020 05:17 PM   Modules accepted: Orders

## 2020-08-25 ENCOUNTER — Encounter: Payer: Self-pay | Admitting: Podiatry

## 2020-08-25 DIAGNOSIS — M2011 Hallux valgus (acquired), right foot: Secondary | ICD-10-CM

## 2020-08-31 ENCOUNTER — Ambulatory Visit (INDEPENDENT_AMBULATORY_CARE_PROVIDER_SITE_OTHER): Payer: 59

## 2020-08-31 ENCOUNTER — Other Ambulatory Visit: Payer: Self-pay

## 2020-08-31 ENCOUNTER — Encounter: Payer: Self-pay | Admitting: Podiatry

## 2020-08-31 ENCOUNTER — Ambulatory Visit (INDEPENDENT_AMBULATORY_CARE_PROVIDER_SITE_OTHER): Payer: 59 | Admitting: Podiatry

## 2020-08-31 DIAGNOSIS — Z9889 Other specified postprocedural states: Secondary | ICD-10-CM | POA: Diagnosis not present

## 2020-08-31 NOTE — Progress Notes (Signed)
Subjective:   Patient ID: Margaret Dougherty, female   DOB: 65 y.o.   MRN: [id number]   HPI Patient states doing very well with surgery very pleased minimal     ROS      Objective:  Physical Exam  Neurovascular status intact negative Bevelyn Buckles' sign noted wound edges coapted well hallux rectus position     Assessment:  Doing well post biplanar decompression osteotomy right first metatarsal     Plan:  Sterile dressing reapplied surgical shoe dispensed x-ray reviewed and explained range of motion exercises along with continued compression elevation immobilization.  Reappoint 3 weeks or earlier if needed  X-rays indicate osteotomies healing well fixation in place good alignment joint open

## 2020-09-21 ENCOUNTER — Other Ambulatory Visit: Payer: Self-pay | Admitting: Family Medicine

## 2020-09-21 ENCOUNTER — Other Ambulatory Visit: Payer: Self-pay

## 2020-09-21 ENCOUNTER — Encounter: Payer: Self-pay | Admitting: Podiatry

## 2020-09-21 ENCOUNTER — Other Ambulatory Visit (HOSPITAL_COMMUNITY)
Admission: RE | Admit: 2020-09-21 | Discharge: 2020-09-21 | Disposition: A | Discharge status: Home / Self Care | Payer: 59 | Source: Ambulatory Visit | Attending: Family Medicine | Admitting: Family Medicine

## 2020-09-21 ENCOUNTER — Ambulatory Visit (INDEPENDENT_AMBULATORY_CARE_PROVIDER_SITE_OTHER): Payer: 59 | Admitting: Podiatry

## 2020-09-21 ENCOUNTER — Ambulatory Visit (INDEPENDENT_AMBULATORY_CARE_PROVIDER_SITE_OTHER): Payer: 59

## 2020-09-21 ENCOUNTER — Encounter: Payer: 59 | Admitting: Podiatry

## 2020-09-21 DIAGNOSIS — Z01411 Encounter for gynecological examination (general) (routine) with abnormal findings: Secondary | ICD-10-CM | POA: Insufficient documentation

## 2020-09-21 DIAGNOSIS — Z9889 Other specified postprocedural states: Secondary | ICD-10-CM | POA: Diagnosis not present

## 2020-09-21 NOTE — Progress Notes (Signed)
Subjective:   Patient ID: Margaret Dougherty, female   DOB: 65 y.o.   MRN: [id number]   HPI Patient states doing very well with foot very pleased with mild swelling and I am working on the motion   ROS      Objective:  Physical Exam  Neurovascular status intact negative Bevelyn Buckles' sign noted wound edges well coapted first MPJ motion is good no crepitus of the joint mild tightness noted     Assessment:  Doing well post osteotomy right first metatarsal     Plan:  H&P x-ray reviewed and I have recommended the continuation of range of motion exercises gradual return to soft shoe and dispensed ankle compression stocking.  Reappoint 6 weeks or earlier if needed  X-rays indicate osteotomies healing well fixation in place joint congruence

## 2020-09-24 LAB — CYTOLOGY - PAP
Comment: NEGATIVE
Diagnosis: NEGATIVE
High risk HPV: NEGATIVE

## 2020-11-02 ENCOUNTER — Other Ambulatory Visit: Payer: Self-pay

## 2020-11-02 ENCOUNTER — Encounter: Payer: Self-pay | Admitting: Podiatry

## 2020-11-02 ENCOUNTER — Ambulatory Visit (INDEPENDENT_AMBULATORY_CARE_PROVIDER_SITE_OTHER): Payer: 59

## 2020-11-02 ENCOUNTER — Ambulatory Visit (INDEPENDENT_AMBULATORY_CARE_PROVIDER_SITE_OTHER): Payer: 59 | Admitting: Podiatry

## 2020-11-02 DIAGNOSIS — M205X1 Other deformities of toe(s) (acquired), right foot: Secondary | ICD-10-CM

## 2020-11-03 NOTE — Progress Notes (Signed)
Subjective:   Patient ID: Margaret Dougherty, female   DOB: 65 y.o.   MRN: [id number]   HPI Patient states doing very well with surgery and pleased so far and is just getting back into shoes   ROS      Objective:  Physical Exam  Neurovascular status intact negative Bevelyn Buckles' sign noted right foot healing well wound edges well coapted and moderately restricted but good range of motion with no crepitus     Assessment:  Doing well post osteotomy right first metatarsal     Plan:  Instructed on continued range of motion exercises and gradual increase in activity and dispensed ankle compression stocking to reduce swelling.  Patient will be seen back to recheck and is encouraged to call if any issues were to occur  X-rays indicate osteotomies healing well fixation in place joint congruence

## 2020-12-10 ENCOUNTER — Other Ambulatory Visit (HOSPITAL_BASED_OUTPATIENT_CLINIC_OR_DEPARTMENT_OTHER): Payer: Self-pay

## 2020-12-10 MED ORDER — FLUARIX QUADRIVALENT 0.5 ML IM SUSY
PREFILLED_SYRINGE | INTRAMUSCULAR | 0 refills | Status: AC
  Filled 2020-12-10: qty 0.5, 1d supply, fill #0

## 2021-05-03 ENCOUNTER — Other Ambulatory Visit: Payer: Self-pay | Admitting: Family Medicine

## 2021-05-12 ENCOUNTER — Other Ambulatory Visit: Payer: Self-pay | Admitting: Family Medicine

## 2021-05-12 DIAGNOSIS — E2839 Other primary ovarian failure: Secondary | ICD-10-CM

## 2021-10-08 DIAGNOSIS — Z79899 Other long term (current) drug therapy: Secondary | ICD-10-CM | POA: Diagnosis not present

## 2021-10-08 DIAGNOSIS — E559 Vitamin D deficiency, unspecified: Secondary | ICD-10-CM | POA: Diagnosis not present

## 2021-10-08 DIAGNOSIS — E7800 Pure hypercholesterolemia, unspecified: Secondary | ICD-10-CM | POA: Diagnosis not present

## 2021-10-12 DIAGNOSIS — Z0000 Encounter for general adult medical examination without abnormal findings: Secondary | ICD-10-CM | POA: Diagnosis not present

## 2021-10-12 DIAGNOSIS — M810 Age-related osteoporosis without current pathological fracture: Secondary | ICD-10-CM | POA: Diagnosis not present

## 2021-10-12 DIAGNOSIS — E559 Vitamin D deficiency, unspecified: Secondary | ICD-10-CM | POA: Diagnosis not present

## 2021-10-12 DIAGNOSIS — Z79890 Hormone replacement therapy (postmenopausal): Secondary | ICD-10-CM | POA: Diagnosis not present

## 2021-12-07 DIAGNOSIS — M810 Age-related osteoporosis without current pathological fracture: Secondary | ICD-10-CM | POA: Diagnosis not present

## 2021-12-07 DIAGNOSIS — Z780 Asymptomatic menopausal state: Secondary | ICD-10-CM | POA: Diagnosis not present

## 2021-12-07 DIAGNOSIS — M85851 Other specified disorders of bone density and structure, right thigh: Secondary | ICD-10-CM | POA: Diagnosis not present

## 2021-12-07 DIAGNOSIS — Z1231 Encounter for screening mammogram for malignant neoplasm of breast: Secondary | ICD-10-CM | POA: Diagnosis not present

## 2021-12-07 DIAGNOSIS — M85852 Other specified disorders of bone density and structure, left thigh: Secondary | ICD-10-CM | POA: Diagnosis not present

## 2022-06-30 ENCOUNTER — Ambulatory Visit: Payer: Medicare Other | Admitting: Dermatology

## 2022-06-30 DIAGNOSIS — C44722 Squamous cell carcinoma of skin of right lower limb, including hip: Secondary | ICD-10-CM

## 2022-06-30 DIAGNOSIS — C4492 Squamous cell carcinoma of skin, unspecified: Secondary | ICD-10-CM

## 2022-06-30 DIAGNOSIS — D489 Neoplasm of uncertain behavior, unspecified: Secondary | ICD-10-CM

## 2022-06-30 HISTORY — DX: Squamous cell carcinoma of skin, unspecified: C44.92

## 2022-06-30 NOTE — Patient Instructions (Signed)
Patient Handout: Wound Care for Skin Biopsy Site  Patient Handout: Wound Care for Skin Biopsy Site  Taking Care of Your Skin Biopsy Site  Proper care of the biopsy site is essential for promoting healing and minimizing scarring. This handout provides instructions on how to care for your biopsy site to ensure optimal recovery.  1. Cleaning the Wound:  Clean the biopsy site daily with gentle soap and water. Gently pat the area dry with a clean, soft towel. Avoid harsh scrubbing or rubbing the area, as this can irritate the skin and delay healing.  2. Applying Aquaphor and Bandage:  After cleaning the wound, apply a thin layer of Aquaphor ointment to the biopsy site. Cover the area with a sterile bandage to protect it from dirt, bacteria, and friction. Change the bandage daily or as needed if it becomes soiled or wet.  3. Continued Care for One Week:  Repeat the cleaning, Aquaphor application, and bandaging process daily for one week following the biopsy procedure. Keeping the wound clean and moist during this initial healing period will help prevent infection and promote optimal healing.  4. Massaging Aquaphor into the Area:  ---After one week, discontinue the use of bandages but continue to apply Aquaphor to the biopsy site. ----Gently massage the Aquaphor into the area using circular motions. ---Massaging the skin helps to promote circulation and prevent the formation of scar tissue.   Additional Tips:  Avoid exposing the biopsy site to direct sunlight during the healing process, as this can cause hyperpigmentation or worsen scarring. If you experience any signs of infection, such as increased redness, swelling, warmth, or drainage from the wound, contact your healthcare provider immediately. Follow any additional instructions provided by your healthcare provider for caring for the biopsy site and managing any discomfort. Conclusion:  Taking proper care of your skin biopsy site  is crucial for ensuring optimal healing and minimizing scarring. By following these instructions for cleaning, applying Aquaphor, and massaging the area, you can promote a smooth and successful recovery. If you have any questions or concerns about caring for your biopsy site, don't hesitate to contact your healthcare provider for guidance.      Due to recent changes in healthcare laws, you may see results of your pathology and/or laboratory studies on MyChart before the doctors have had a chance to review them. We understand that in some cases there may be results that are confusing or concerning to you. Please understand that not all results are received at the same time and often the doctors may need to interpret multiple results in order to provide you with the best plan of care or course of treatment. Therefore, we ask that you please give us 2 business days to thoroughly review all your results before contacting the office for clarification. Should we see a critical lab result, you will be contacted sooner.   If You Need Anything After Your Visit  If you have any questions or concerns for your doctor, please call our main line at [phone] If no one answers, please leave a voicemail as directed and we will return your call as soon as possible. Messages left after 4 pm will be answered the following business day.   You may also send us a message via MyChart. We typically respond to MyChart messages within 1-2 business days.  For prescription refills, please ask your pharmacy to contact our office. Our fax number is [phone].  If you have an urgent issue when the clinic is   closed that cannot wait until the next business day, you can page your doctor at the number below.    Please note that while we do our best to be available for urgent issues outside of office hours, we are not available 24/7.   If you have an urgent issue and are unable to reach us, you may choose to seek medical care  at your doctor's office, retail clinic, urgent care center, or emergency room.  If you have a medical emergency, please immediately call 911 or go to the emergency department. In the event of inclement weather, please call our main line at [phone] for an update on the status of any delays or closures.  Dermatology Medication Tips: Please keep the boxes that topical medications come in in order to help keep track of the instructions about where and how to use these. Pharmacies typically print the medication instructions only on the boxes and not directly on the medication tubes.   If your medication is too expensive, please contact our office at [phone] or send us a message through MyChart.   We are unable to tell what your co-pay for medications will be in advance as this is different depending on your insurance coverage. However, we may be able to find a substitute medication at lower cost or fill out paperwork to get insurance to cover a needed medication.   If a prior authorization is required to get your medication covered by your insurance company, please allow us 1-2 business days to complete this process.  Drug prices often vary depending on where the prescription is filled and some pharmacies may offer cheaper prices.  The website www.goodrx.com contains coupons for medications through different pharmacies. The prices here do not account for what the cost may be with help from insurance (it may be cheaper with your insurance), but the website can give you the price if you did not use any insurance.  - You can print the associated coupon and take it with your prescription to the pharmacy.  - You may also stop by our office during regular business hours and pick up a GoodRx coupon card.  - If you need your prescription sent electronically to a different pharmacy, notify our office through [facility] MyChart or by phone at [phone]     

## 2022-06-30 NOTE — Progress Notes (Signed)
   New Patient Visit   Subjective  Margaret Dougherty is a 67 y.o. female who presents for the following: Spot check  Spot on right calf. It has been there for about four months. Possibly from shaving. It will bleed sometimes when it gets rubbed and is painful when it gets irritated. No family hx of skin cancer and no personal history of skin cancer.    The following portions of the chart were reviewed this encounter and updated as appropriate: medications, allergies, medical history  Review of Systems:  No other skin or systemic complaints except as noted in HPI or Assessment and Plan.  Objective  Well appearing patient in no apparent distress; mood and affect are within normal limits.  A focused examination was performed of the following areas: Right calf  Relevant exam findings are noted in the Assessment and Plan.  Right Lower Leg - Posterior Irregular brown crust macule     Assessment & Plan     Neoplasm of uncertain behavior Right Lower Leg - Posterior  Skin / nail biopsy Type of biopsy: tangential   Informed consent: discussed and consent obtained   Timeout: patient name, date of birth, surgical site, and procedure verified   Procedure prep:  Patient was prepped and draped in usual sterile fashion Prep type:  Isopropyl alcohol Anesthesia: the lesion was anesthetized in a standard fashion   Anesthetic:  1% lidocaine w/ epinephrine 1-100,000 buffered w/ 8.4% NaHCO3 Instrument used: DermaBlade   Hemostasis achieved with: aluminum chloride   Outcome: patient tolerated procedure well   Post-procedure details: sterile dressing applied and wound care instructions given   Dressing type: petrolatum gauze and bandage    Specimen 1 - Surgical pathology Differential Diagnosis: SCC vs Pre cancer  Check Margins: No    Return if symptoms worsen or fail to improve.    Documentation: I have reviewed the above documentation for accuracy and completeness, and I agree with  the above.  Langston Reusing, DO  I, Germaine Pomfret, CMA, am acting as scribe for [employer], DO.

## 2022-07-18 ENCOUNTER — Encounter: Payer: Self-pay | Admitting: Dermatology

## 2022-07-27 ENCOUNTER — Telehealth: Payer: Self-pay

## 2022-07-27 NOTE — Telephone Encounter (Signed)
-----   Message from Terri Piedra, DO sent at 07/27/2022  5:03 PM EDT ----- Please call pt and notify their bx results were positive for a skin CA that will be excised by Dr. Onalee Hua  Please schedule 30 min surgery  Diagnosis Skin , right lower leg posterior WELL DIFFERENTIATED SQUAMOUS CELL CARCINOMA

## 2022-07-27 NOTE — Telephone Encounter (Signed)
Called patient. N/A. LMOVM to C/B regarding path results and Tx

## 2022-07-27 NOTE — Progress Notes (Signed)
Please call pt and notify their bx results were positive for a skin CA that will be excised by Dr. Onalee Hua  Please schedule 30 min surgery  Diagnosis Skin , right lower leg posterior WELL DIFFERENTIATED SQUAMOUS CELL CARCINOMA

## 2022-07-28 ENCOUNTER — Other Ambulatory Visit: Payer: Self-pay

## 2022-07-28 ENCOUNTER — Telehealth: Payer: Self-pay

## 2022-07-28 DIAGNOSIS — C44722 Squamous cell carcinoma of skin of right lower limb, including hip: Secondary | ICD-10-CM

## 2022-07-28 NOTE — Telephone Encounter (Signed)
Yes, we can refer to Dr. Ulice Bold the plastic surgeon.  Please send referral and call pt and let her know we will refer her to Dr. Ulice Bold

## 2022-07-28 NOTE — Telephone Encounter (Signed)
The pateint called today after a message was left yesterday. 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

## 2022-08-18 ENCOUNTER — Encounter: Payer: Self-pay | Admitting: Plastic Surgery

## 2022-08-18 ENCOUNTER — Ambulatory Visit: Payer: Medicare Other | Admitting: Plastic Surgery

## 2022-08-18 VITALS — BP 105/68 | HR 86

## 2022-08-18 DIAGNOSIS — D0471 Carcinoma in situ of skin of right lower limb, including hip: Secondary | ICD-10-CM

## 2022-08-18 NOTE — Progress Notes (Signed)
Referring Provider Mila Palmer, MD [id number] [place],  [place] [address]   CC:  Chief Complaint  Patient presents with   Advice Only   Skin Problem      Margaret Dougherty is an 67 y.o. female.  HPI: Margaret Dougherty is a 67 year old female who is referred from Dr. Onalee Hua for excision of a squamous cell carcinoma on the medial calf right leg.  The patient noted onset of the lesion approximately 3 months ago and had a biopsy and Dr. Kermit Balo office.  The lesion measures approximately 5 mm x 5 mm.  No Known Allergies  Outpatient Encounter Medications as of 08/18/2022  Medication Sig   influenza vac split quadrivalent PF (FLUARIX QUADRIVALENT) 0.5 ML injection Inject into the muscle.   NON FORMULARY [facility]  Scar Cream: Verapamil 10%; Pentoxifylline 5%; 60 gm Refills = one  Faxed to [facility] on 09/23/19   NONFORMULARY OR COMPOUNDED ITEM Estradiol 0.02% vaginal cream twice weekly vaginally   ondansetron (ZOFRAN) 4 MG tablet Take 1 tablet (4 mg total) by mouth every 8 (eight) hours as needed for nausea or vomiting.   oxyCODONE-acetaminophen (PERCOCET) 10-325 MG tablet Take 1 tablet by mouth every 4 (four) hours as needed for pain.   No facility-administered encounter medications on file as of 08/18/2022.     Past Medical History:  Diagnosis Date   Fibroid    Osteoporosis 05/2016   T score -2.7   Squamous cell carcinoma of skin 06/30/2022   Right lower leg, posterior. WD SCC. Excision pending.   Vertigo     Past Surgical History:  Procedure Laterality Date   CESAREAN SECTION     HERNIA REPAIR     MYOMECTOMY VAGINAL APPROACH  1999    Family History  Problem Relation Age of Onset   Heart disease Mother    Breast cancer Neg Hx     Social History   Social History Narrative   Not on file     Review of Systems General: Denies fevers, chills, weight loss CV: Denies chest pain, shortness of breath, palpitations Skin: Biopsy proven  squamous cell carcinoma of the leg.  Patient notes bleeding when her clothing rubs against it.  Physical Exam    08/18/2022    9:18 AM 05/08/2017    8:45 AM 06/22/2016    9:33 AM  Vitals with BMI  Height  5\' 5"    Weight  120 lbs   BMI  19.97   Systolic 105 114 403  Diastolic 68 70 76  Pulse 86      General:  No acute distress,  Alert and oriented, Non-Toxic, Normal speech and affect Skin: There is a 5 x 5 mm area of excoriated tissue on the medial aspect of the right calf near the popliteal fossa.  There is a well-healed biopsy site in the center of the lesion.  There are no other surrounding lesions. Mammogram: Mammogram in February 2022 was BI-RADS 1, will remind patient that she needs to have a mammogram when she returns for her appointment to have the cancer excised Assessment/Plan Squamous cell carcinoma, right lower extremity: Patient has a biopsy-proven squamous cell carcinoma.  I believe that this would be readily amenable to excision in the office.  The patient has no other significant medical problems and is not on any blood thinners.  We did discuss the fact that she will have a scar postoperatively and that she may require an additional procedure if the margins are positive.  She understands, all questions were answered to her satisfaction.  Photographs were obtained with her consent.  Will schedule at her request.  ROXY MASTANDREA 08/18/2022, 9:34 AM

## 2022-08-22 ENCOUNTER — Ambulatory Visit: Payer: Medicare Other | Admitting: Plastic Surgery

## 2022-08-22 VITALS — BP 123/79 | HR 76

## 2022-08-22 DIAGNOSIS — L988 Other specified disorders of the skin and subcutaneous tissue: Secondary | ICD-10-CM | POA: Diagnosis not present

## 2022-08-22 DIAGNOSIS — D0471 Carcinoma in situ of skin of right lower limb, including hip: Secondary | ICD-10-CM

## 2022-08-22 DIAGNOSIS — L905 Scar conditions and fibrosis of skin: Secondary | ICD-10-CM | POA: Diagnosis not present

## 2022-08-22 NOTE — Progress Notes (Signed)
Procedure Note  Preoperative Dx: Squamous cell carcinoma medial upper right calf  Postoperative Dx: Same  Procedure: Excision of squamous cell carcinoma  Anesthesia: Lidocaine 1% with 1:100,000 epinephrine and 0.25% Sensorcaine   Indication for Procedure: Patient was referred for management of a 1 centimeters by 5 mm squamous cell carcinoma biopsy-proven on the upper inner right calf  Description of Procedure: Risks and complications were explained to the patient including the need for reexcision if there are positive margins..  Consent was confirmed and the patient understands the risks and benefits.  The potential complications and alternatives were explained and the patient consents.  The patient expressed understanding the option of not having the procedure and the risks of a scar.  Time out was called and all information was confirmed to be correct.    The area was prepped and drapped.  Local anesthetic was injected in the subcutaneous tissues.  After waiting for the local to take affect an ellipse oriented in the axis of the extremity was made around the lesion.  Additional 2 mm margins were taken.  After obtaining hemostasis, the surgical wound was closed with interrupted 3-0 Monocryl sutures in the dermis and interrupted 4-0 Prolene sutures and the skin.  The surgical wound measured 1.5 cm.  A dressing was applied.  The patient was given instructions on how to care for the area and a follow up appointment.  Margaret Dougherty tolerated the procedure well and there were no complications. The specimen was sent to pathology.

## 2022-08-29 ENCOUNTER — Ambulatory Visit (INDEPENDENT_AMBULATORY_CARE_PROVIDER_SITE_OTHER): Payer: Medicare Other | Admitting: Surgical

## 2022-08-29 VITALS — BP 101/70 | HR 73

## 2022-08-29 DIAGNOSIS — Z9889 Other specified postprocedural states: Secondary | ICD-10-CM

## 2022-08-29 DIAGNOSIS — D0471 Carcinoma in situ of skin of right lower limb, including hip: Secondary | ICD-10-CM

## 2022-08-29 NOTE — Progress Notes (Signed)
Patient is a very pleasant 67 year old female here for follow-up after reexcision of squamous cell carcinoma of the medial upper right calf, pathology has not yet resulted.  She has 4-0 Prolene sutures in place along the skin.  She is not having any infectious symptoms, she reports she is traveling to [place] this weekend.  On exam right upper calf incision is intact and appears to be healing well.  Prolene sutures are noted.  There is no erythema or cellulitic changes.  No subcutaneous fluid collection.   We discussed leaving the sutures in place for 1 week due to the area being very tight.  She reports that it feels tight and she is comfortable with this plan.  We did discuss that leaving the sutures in for an extended period of time would increase her scarring.  We discussed that removing the sutures too early could result in an incisional dehiscence.  Patient was agreeable to the plan.  Recommend following up in 1 week for suture removal, she returns from her trip on Monday we will plan to see her Tuesday.  Will review pathology results once they are resulted

## 2022-09-05 NOTE — Progress Notes (Signed)
Patient is a pleasant 67 year old female s/p reexcision of SCC right calf performed 08/22/2022 by Dr. Ladona Ridgel who presents to clinic for postprocedural follow-up.  She was last seen here in clinic on 08/29/2022.  The area was particularly tight incision was made to leave the Prolene sutures in place for an additional week.  Discussed the impact on surgical scarring.  Pathology had not yet been discussed.  Reviewed pathology report and the margins are free, no residual SCC.  Today, patient states that it is still a bit tight in her right leg, but improved compared to last week.  She reports that she is very active which was another reason why decision was made to leave the sutures in place.  She feels prepared for removal today.  On exam, the excision site is already well-healed.  Unfortunately, the Prolene mattress sutures are partially buried, likely due in part to her excellent healing.  Required local lidocaine anesthesia given patient discomfort with suture removal attempts.  Eventually the Prolene sutures were removed in their entirety, possibly with the exception of a single fragment.  Prolene fortunately has minimal tissue reactivity and while most of all of the sutures were removed in their entirety, there was a single suture that was particularly buried and there is a chance that a small fragment was left.  Discussed risks with the patient and she is understanding agreeable.  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  Discussed use of silicone scar gels twice daily x 3 months.  Follow-up as needed.  Picture(s) obtained of the patient and placed in the chart were with the patient's or guardian's permission.

## 2022-09-06 ENCOUNTER — Ambulatory Visit (INDEPENDENT_AMBULATORY_CARE_PROVIDER_SITE_OTHER): Payer: Medicare Other | Admitting: Physician Assistant

## 2022-09-06 ENCOUNTER — Encounter: Payer: Self-pay | Admitting: Physician Assistant

## 2022-09-06 VITALS — BP 138/81 | HR 83 | Ht 65.0 in | Wt 125.0 lb

## 2022-09-06 DIAGNOSIS — D0471 Carcinoma in situ of skin of right lower limb, including hip: Secondary | ICD-10-CM

## 2022-09-06 DIAGNOSIS — Z4802 Encounter for removal of sutures: Secondary | ICD-10-CM

## 2022-10-05 DIAGNOSIS — H5203 Hypermetropia, bilateral: Secondary | ICD-10-CM | POA: Diagnosis not present

## 2022-10-06 DIAGNOSIS — L989 Disorder of the skin and subcutaneous tissue, unspecified: Secondary | ICD-10-CM | POA: Diagnosis not present

## 2022-11-09 ENCOUNTER — Ambulatory Visit: Payer: Medicare Other | Admitting: Dermatology

## 2022-11-09 ENCOUNTER — Encounter: Payer: Self-pay | Admitting: Dermatology

## 2022-11-09 VITALS — BP 129/77 | HR 64

## 2022-11-09 DIAGNOSIS — Z85828 Personal history of other malignant neoplasm of skin: Secondary | ICD-10-CM

## 2022-11-09 DIAGNOSIS — L821 Other seborrheic keratosis: Secondary | ICD-10-CM

## 2022-11-09 DIAGNOSIS — L570 Actinic keratosis: Secondary | ICD-10-CM

## 2022-11-09 DIAGNOSIS — L578 Other skin changes due to chronic exposure to nonionizing radiation: Secondary | ICD-10-CM

## 2022-11-09 DIAGNOSIS — L814 Other melanin hyperpigmentation: Secondary | ICD-10-CM

## 2022-11-09 DIAGNOSIS — L820 Inflamed seborrheic keratosis: Secondary | ICD-10-CM | POA: Diagnosis not present

## 2022-11-09 DIAGNOSIS — Z1283 Encounter for screening for malignant neoplasm of skin: Secondary | ICD-10-CM | POA: Diagnosis not present

## 2022-11-09 DIAGNOSIS — W908XXA Exposure to other nonionizing radiation, initial encounter: Secondary | ICD-10-CM

## 2022-11-09 DIAGNOSIS — D229 Melanocytic nevi, unspecified: Secondary | ICD-10-CM

## 2022-11-09 DIAGNOSIS — D1801 Hemangioma of skin and subcutaneous tissue: Secondary | ICD-10-CM

## 2022-11-09 NOTE — Progress Notes (Signed)
   New Patient Visit     New Patient Visit   Subjective  Margaret Dougherty is a 67 y.o. female who presents for the following: Skin Cancer Screening and Full Body Skin Exam  The patient presents for Total-Body Skin Exam (TBSE) for skin cancer screening and mole check. The patient has spots, moles and lesions to be evaluated, some may be new or changing and the patient may have concern these could be cancer. Pt has hx of SCC may 2024 on right lower leg  The following portions of the chart were reviewed this encounter and updated as appropriate: medications, allergies, medical history  Review of Systems:  No other skin or systemic complaints except as noted in HPI or Assessment and Plan.  Objective  Well appearing patient in no apparent distress; mood and affect are within normal limits.  A full examination was performed including scalp, head, eyes, ears, nose, lips, neck, chest, axillae, abdomen, back, buttocks, bilateral upper extremities, bilateral lower extremities, hands, feet, fingers, toes, fingernails, and toenails. All findings within normal limits unless otherwise noted below.   Relevant physical exam findings are noted in the Assessment and Plan.  Left Supraclavicular Area Inflamed stuck on pink papule    Assessment & Plan   SKIN CANCER SCREENING PERFORMED TODAY.  ACTINIC DAMAGE - Chronic condition, secondary to cumulative UV/sun exposure - diffuse scaly erythematous macules with underlying dyspigmentation - Recommend daily broad spectrum sunscreen SPF 30+ to sun-exposed areas, reapply every 2 hours as needed.  - Staying in the shade or wearing long sleeves, sun glasses (UVA+UVB protection) and wide brim hats (4-inch brim around the entire circumference of the hat) are also recommended for sun protection.  - Call for new or changing lesions.  MELANOCYTIC NEVI - Tan-brown and/or pink-flesh-colored symmetric macules and papules - Benign appearing on exam today -  Observation - Call clinic for new or changing moles - Recommend daily use of broad spectrum spf 30+ sunscreen to sun-exposed areas.   SEBORRHEIC KERATOSIS - Stuck-on, waxy, tan-brown papules and/or plaques  - Benign-appearing - Discussed benign etiology and prognosis. - Observe - Call for any changes  LENTIGINES Exam: scattered tan macules Due to sun exposure Treatment Plan: Benign-appearing, observe. Recommend daily broad spectrum sunscreen SPF 30+ to sun-exposed areas, reapply every 2 hours as needed.  Call for any changes    HEMANGIOMA Exam: red papule(s) Discussed benign nature. Recommend observation. Call for changes.   AK (actinic keratosis) (3) Right and left Hand and left jawline  Destruction of lesion - Right and left Hand and left jawline (3)  Destruction method: cryotherapy   Lesion destroyed using liquid nitrogen: Yes   Region frozen until ice ball extended beyond lesion: Yes    Inflamed seborrheic keratosis Left Supraclavicular Area  Destruction of lesion - Left Supraclavicular Area Complexity: simple   Destruction method: cryotherapy   Informed consent: discussed and consent obtained   Timeout:  patient name, date of birth, surgical site, and procedure verified Lesion destroyed using liquid nitrogen: Yes   Region frozen until ice ball extended beyond lesion: Yes   Outcome: patient tolerated procedure well with no complications     Return in about 6 months (around 05/09/2023) for TBSE.  I, Tillie Fantasia, CMA, am acting as scribe for Gwenith Daily, MD.   Documentation: I have reviewed the above documentation for accuracy and completeness, and I agree with the above.  Gwenith Daily, MD

## 2022-11-09 NOTE — Patient Instructions (Addendum)

## 2022-12-07 DIAGNOSIS — Z23 Encounter for immunization: Secondary | ICD-10-CM | POA: Diagnosis not present

## 2022-12-07 DIAGNOSIS — Z1159 Encounter for screening for other viral diseases: Secondary | ICD-10-CM | POA: Diagnosis not present

## 2022-12-07 DIAGNOSIS — Z79899 Other long term (current) drug therapy: Secondary | ICD-10-CM | POA: Diagnosis not present

## 2022-12-07 DIAGNOSIS — E7800 Pure hypercholesterolemia, unspecified: Secondary | ICD-10-CM | POA: Diagnosis not present

## 2022-12-07 DIAGNOSIS — Z0000 Encounter for general adult medical examination without abnormal findings: Secondary | ICD-10-CM | POA: Diagnosis not present

## 2022-12-07 DIAGNOSIS — E559 Vitamin D deficiency, unspecified: Secondary | ICD-10-CM | POA: Diagnosis not present

## 2022-12-13 DIAGNOSIS — Z1231 Encounter for screening mammogram for malignant neoplasm of breast: Secondary | ICD-10-CM | POA: Diagnosis not present

## 2022-12-22 DIAGNOSIS — Z23 Encounter for immunization: Secondary | ICD-10-CM | POA: Diagnosis not present

## 2023-04-26 ENCOUNTER — Ambulatory Visit: Payer: Medicare Other | Admitting: Dermatology

## 2023-04-26 ENCOUNTER — Encounter: Payer: Self-pay | Admitting: Dermatology

## 2023-04-26 VITALS — BP 100/64 | HR 72

## 2023-04-26 DIAGNOSIS — L821 Other seborrheic keratosis: Secondary | ICD-10-CM

## 2023-04-26 DIAGNOSIS — W908XXA Exposure to other nonionizing radiation, initial encounter: Secondary | ICD-10-CM

## 2023-04-26 DIAGNOSIS — Z1283 Encounter for screening for malignant neoplasm of skin: Secondary | ICD-10-CM

## 2023-04-26 DIAGNOSIS — L570 Actinic keratosis: Secondary | ICD-10-CM

## 2023-04-26 DIAGNOSIS — D1801 Hemangioma of skin and subcutaneous tissue: Secondary | ICD-10-CM | POA: Diagnosis not present

## 2023-04-26 DIAGNOSIS — L814 Other melanin hyperpigmentation: Secondary | ICD-10-CM | POA: Diagnosis not present

## 2023-04-26 DIAGNOSIS — D229 Melanocytic nevi, unspecified: Secondary | ICD-10-CM

## 2023-04-26 DIAGNOSIS — L578 Other skin changes due to chronic exposure to nonionizing radiation: Secondary | ICD-10-CM

## 2023-04-26 DIAGNOSIS — Z85828 Personal history of other malignant neoplasm of skin: Secondary | ICD-10-CM

## 2023-04-26 NOTE — Patient Instructions (Signed)

## 2023-04-26 NOTE — Progress Notes (Addendum)
   Follow-Up Visit   Subjective  Margaret Dougherty is a 68 y.o. female who presents for the following: Skin Cancer Screening and Full Body Skin Exam  The patient presents for Total-Body Skin Exam (TBSE) for skin cancer screening and mole check. The patient has spots, moles and lesions to be evaluated, some may be new or changing. Pt has hx of SCC right lower leg 2024.  The following portions of the chart were reviewed this encounter and updated as appropriate: medications, allergies, medical history  Review of Systems:  No other skin or systemic complaints except as noted in HPI or Assessment and Plan.  Objective  Well appearing patient in no apparent distress; mood and affect are within normal limits.  A full examination was performed including scalp, head, eyes, ears, nose, lips, neck, chest, axillae, abdomen, back, buttocks, bilateral upper extremities, bilateral lower extremities, hands, feet, fingers, toes, fingernails, and toenails. All findings within normal limits unless otherwise noted below.   Relevant physical exam findings are noted in the Assessment and Plan.  Right Lower Leg - Posterior Erythematous thin papules/macules with gritty scale.   Assessment & Plan   SKIN CANCER SCREENING PERFORMED TODAY.  ACTINIC DAMAGE - Chronic condition, secondary to cumulative UV/sun exposure - diffuse scaly erythematous macules with underlying dyspigmentation - Recommend daily broad spectrum sunscreen SPF 30+ to sun-exposed areas, reapply every 2 hours as needed.  - Staying in the shade or wearing long sleeves, sun glasses (UVA+UVB protection) and wide brim hats (4-inch brim around the entire circumference of the hat) are also recommended for sun protection.  - Call for new or changing lesions.  MELANOCYTIC NEVI - Tan-brown and/or pink-flesh-colored symmetric macules and papules - Benign appearing on exam today - Observation - Call clinic for new or changing moles - Recommend daily  use of broad spectrum spf 30+ sunscreen to sun-exposed areas.   LENTIGINES Exam: scattered tan macules Due to sun exposure Treatment Plan: Benign-appearing, observe. Recommend daily broad spectrum sunscreen SPF 30+ to sun-exposed areas, reapply every 2 hours as needed.  Call for any changes   HEMANGIOMA Exam: red papule(s) Discussed benign nature. Recommend observation. Call for changes.   SEBORRHEIC KERATOSIS - Stuck-on, waxy, tan-brown papules and/or plaques  - Benign-appearing - Discussed benign etiology and prognosis. - Observe - Call for any changes  AK (ACTINIC KERATOSIS) Right Lower Leg - Posterior Destruction of lesion - Right Lower Leg - Posterior  Destruction method: cryotherapy   Informed consent: discussed and consent obtained   Timeout:  patient name, date of birth, surgical site, and procedure verified Outcome: patient tolerated procedure well with no complications   Return in about 2 months (around 06/26/2023) for AK f/u, tbse .  I, Tillie Fantasia, CMA, am acting as scribe for Gwenith Daily, MD.   Documentation: I have reviewed the above documentation for accuracy and completeness, and I agree with the above.  Gwenith Daily, MD

## 2023-06-20 ENCOUNTER — Ambulatory Visit: Admitting: Dermatology

## 2023-06-20 ENCOUNTER — Encounter: Payer: Self-pay | Admitting: Dermatology

## 2023-06-20 VITALS — BP 136/66 | HR 62

## 2023-06-20 DIAGNOSIS — W908XXA Exposure to other nonionizing radiation, initial encounter: Secondary | ICD-10-CM | POA: Diagnosis not present

## 2023-06-20 DIAGNOSIS — L570 Actinic keratosis: Secondary | ICD-10-CM | POA: Diagnosis not present

## 2023-06-20 DIAGNOSIS — D492 Neoplasm of unspecified behavior of bone, soft tissue, and skin: Secondary | ICD-10-CM

## 2023-06-20 DIAGNOSIS — T1490XD Injury, unspecified, subsequent encounter: Secondary | ICD-10-CM

## 2023-06-20 NOTE — Patient Instructions (Signed)

## 2023-06-20 NOTE — Progress Notes (Signed)
   Follow-Up Visit   Subjective  Margaret Dougherty is a 68 y.o. female who presents for the following: follow up after treatment for Aks. She has had difficulty healing the AK on her right lower leg that was cryoed at her last visit.   The following portions of the chart were reviewed this encounter and updated as appropriate: medications, allergies, medical history  Review of Systems:  No other skin or systemic complaints except as noted in HPI or Assessment and Plan.  Objective  Well appearing patient in no apparent distress; mood and affect are within normal limits.   A focused examination was performed of the following areas:  Right lower leg  Relevant exam findings are noted in the Assessment and Plan.  Chest - Medial [facility]), Left Thigh - Anterior Erythematous thin papules/macules with gritty scale.   Assessment & Plan   Previously cryoed Neoplasm- Right posterior thigh- Ddx HAK vs healing wound vs SCC - Recommend monitoring for changes, will reassess at next visit  ACTINIC KERATOSIS Exam: Erythematous thin papules/macules with gritty scale  Actinic keratoses are precancerous spots that appear secondary to cumulative UV radiation exposure/sun exposure over time. They are chronic with expected duration over 1 year. A portion of actinic keratoses will progress to squamous cell carcinoma of the skin. It is not possible to reliably predict which spots will progress to skin cancer and so treatment is recommended to prevent development of skin cancer.  Recommend daily broad spectrum sunscreen SPF 30+ to sun-exposed areas, reapply every 2 hours as needed.  Recommend staying in the shade or wearing long sleeves, sun glasses (UVA+UVB protection) and wide brim hats (4-inch brim around the entire circumference of the hat). Call for new or changing lesions.  Treatment Plan:  Prior to procedure, discussed risks of blister formation, small wound, skin dyspigmentation, or rare scar  following cryotherapy. Recommend Vaseline ointment to treated areas while healing.  Destruction Procedure Note Destruction method: cryotherapy   Informed consent: discussed and consent obtained   Lesion destroyed using liquid nitrogen: Yes   Outcome: patient tolerated procedure well with no complications   Post-procedure details: wound care instructions given   Locations: 2 # of Lesions Treated: chest, left thigh  AK (ACTINIC KERATOSIS) (2) Chest - Medial (Center), Left Thigh - Anterior Destruction of lesion - Chest - Medial (Center), Left Thigh - Anterior Complexity: simple   Destruction method: cryotherapy   Informed consent: discussed and consent obtained   Timeout:  patient name, date of birth, surgical site, and procedure verified Lesion destroyed using liquid nitrogen: Yes   Region frozen until ice ball extended beyond lesion: Yes   Cryotherapy cycles:  2 HEALING WOUND    Return in about 1 month (around 07/20/2023) for AK f/u.   Documentation: I have reviewed the above documentation for accuracy and completeness, and I agree with the above.  Deneise Finlay, MD

## 2023-07-27 ENCOUNTER — Encounter: Payer: Self-pay | Admitting: Dermatology

## 2023-07-27 ENCOUNTER — Ambulatory Visit: Admitting: Dermatology

## 2023-07-27 VITALS — BP 125/69 | HR 79

## 2023-07-27 DIAGNOSIS — L905 Scar conditions and fibrosis of skin: Secondary | ICD-10-CM | POA: Diagnosis not present

## 2023-07-27 DIAGNOSIS — L570 Actinic keratosis: Secondary | ICD-10-CM

## 2023-07-27 DIAGNOSIS — Z872 Personal history of diseases of the skin and subcutaneous tissue: Secondary | ICD-10-CM | POA: Diagnosis not present

## 2023-07-27 NOTE — Patient Instructions (Addendum)

## 2023-07-27 NOTE — Progress Notes (Signed)
   Follow-Up Visit   Subjective  Margaret Dougherty is a 68 y.o. female who presents for the following: AKs The patient following up on Aks that were treated with LN2 on 06/20/23. Pt states the spots have resolved. She has a spot that was treated on 04/26/23 that hadn't been healing but now is doing better.  The following portions of the chart were reviewed this encounter and updated as appropriate: medications, allergies, medical history  Review of Systems:  No other skin or systemic complaints except as noted in HPI or Assessment and Plan.  Objective  Well appearing patient in no apparent distress; mood and affect are within normal limits.  A focused examination was performed of the following areas: Left thigh and chest  Relevant exam findings are noted in the Assessment and Plan.     Assessment & Plan   HISTORY OF PRECANCEROUS ACTINIC KERATOSIS on left thigh, right calf and chest - site(s) of PreCancerous Actinic Keratosis clear today. - these may recur and new lesions may form requiring treatment to prevent transformation into skin cancer - observe for new or changing spots and contact [facility] for appointment if occur - photoprotection with sun protective clothing; sunglasses and broad spectrum sunscreen with SPF of at least 30 + and frequent self skin exams recommended - yearly exams by a dermatologist recommended for persons with history of PreCancerous Actinic Keratoses   Scar s/p cryo for AK on right posterior thigh - Reassured that wound has healed well - Discussed that scars take up to 12 months to mature from the date of surgery - Recommend SPF 30+ to scar daily to prevent purple color - OK to start scar massage at 4-6 weeks post-op - Can consider silicone based products for scar healing  Return in about 3 months (around 10/27/2023) for FBSE.  I, Wilson Hasten, CMA, am acting as scribe for Deneise Finlay, MD.   Documentation: I have reviewed the above  documentation for accuracy and completeness, and I agree with the above.  Deneise Finlay, MD

## 2023-09-25 ENCOUNTER — Ambulatory Visit: Admitting: Dermatology

## 2023-09-25 ENCOUNTER — Encounter: Payer: Self-pay | Admitting: Dermatology

## 2023-09-25 VITALS — BP 105/79 | HR 69

## 2023-09-25 DIAGNOSIS — W908XXA Exposure to other nonionizing radiation, initial encounter: Secondary | ICD-10-CM

## 2023-09-25 DIAGNOSIS — L570 Actinic keratosis: Secondary | ICD-10-CM

## 2023-09-25 DIAGNOSIS — L578 Other skin changes due to chronic exposure to nonionizing radiation: Secondary | ICD-10-CM

## 2023-09-25 DIAGNOSIS — L820 Inflamed seborrheic keratosis: Secondary | ICD-10-CM

## 2023-09-25 NOTE — Progress Notes (Signed)
   Follow-Up Visit   Subjective  Margaret Dougherty is a 68 y.o. female who presents for the following: Spot Check  Patient states she has spot located at the left neck that she would like to have examined. Patient reports the areas have been there for 2 months. She reports the spot can be bothersome. Patient rates irritation 3 out of 10. Patient reports she has not previously been treated for these areas. Patient reports Hx of bx (SCC Right Lower Leg). Patient denies family history of skin cancer(s).  The patient has spots, moles and lesions to be evaluated, some may be new or changing and the patient may have concern these could be cancer.  The following portions of the chart were reviewed this encounter and updated as appropriate: medications, allergies, medical history  Review of Systems:  No other skin or systemic complaints except as noted in HPI or Assessment and Plan.  Objective  Well appearing patient in no apparent distress; mood and affect are within normal limits.  A focused examination was performed of the following areas: Left Neck  Relevant exam findings are noted in the Assessment and Plan.       Neck - Anterior Inflamed stuck on papule   Assessment & Plan   ACTINIC DAMAGE - chronic, secondary to cumulative UV radiation exposure/sun exposure over time - diffuse scaly erythematous macules with underlying dyspigmentation - Recommend daily broad spectrum sunscreen SPF 30+ to sun-exposed areas, reapply every 2 hours as needed.  - Recommend staying in the shade or wearing long sleeves, sun glasses (UVA+UVB protection) and wide brim hats (4-inch brim around the entire circumference of the hat). - Call for new or changing lesions.  INFLAMED SEBORRHEIC KERATOSIS Neck - Anterior Destruction of lesion - Neck - Anterior Complexity: simple   Destruction method: cryotherapy   Timeout:  patient name, date of birth, surgical site, and procedure verified Lesion destroyed using  liquid nitrogen: Yes   Cryotherapy cycles:  1 Outcome: patient tolerated procedure well with no complications   Post-procedure details: wound care instructions given    AK (ACTINIC KERATOSIS)    Return Keep Scheduled appt.  I, Jetta Ager, am acting as scribe for RUFUS CHRISTELLA HOLY, MD.   Documentation: I have reviewed the above documentation for accuracy and completeness, and I agree with the above.  RUFUS CHRISTELLA HOLY, MD

## 2023-09-25 NOTE — Patient Instructions (Addendum)

## 2023-10-26 ENCOUNTER — Ambulatory Visit: Admitting: Dermatology

## 2023-11-09 ENCOUNTER — Ambulatory Visit: Admitting: Dermatology

## 2023-11-09 ENCOUNTER — Encounter: Payer: Self-pay | Admitting: Dermatology

## 2023-11-09 VITALS — BP 110/73 | HR 81

## 2023-11-09 DIAGNOSIS — W908XXA Exposure to other nonionizing radiation, initial encounter: Secondary | ICD-10-CM | POA: Diagnosis not present

## 2023-11-09 DIAGNOSIS — L821 Other seborrheic keratosis: Secondary | ICD-10-CM

## 2023-11-09 DIAGNOSIS — L578 Other skin changes due to chronic exposure to nonionizing radiation: Secondary | ICD-10-CM

## 2023-11-09 DIAGNOSIS — L814 Other melanin hyperpigmentation: Secondary | ICD-10-CM | POA: Diagnosis not present

## 2023-11-09 DIAGNOSIS — Z1283 Encounter for screening for malignant neoplasm of skin: Secondary | ICD-10-CM

## 2023-11-09 DIAGNOSIS — D1801 Hemangioma of skin and subcutaneous tissue: Secondary | ICD-10-CM

## 2023-11-09 DIAGNOSIS — L570 Actinic keratosis: Secondary | ICD-10-CM

## 2023-11-09 DIAGNOSIS — Z85828 Personal history of other malignant neoplasm of skin: Secondary | ICD-10-CM

## 2023-11-09 DIAGNOSIS — D229 Melanocytic nevi, unspecified: Secondary | ICD-10-CM

## 2023-11-09 DIAGNOSIS — Z872 Personal history of diseases of the skin and subcutaneous tissue: Secondary | ICD-10-CM

## 2023-11-09 DIAGNOSIS — Z8589 Personal history of malignant neoplasm of other organs and systems: Secondary | ICD-10-CM

## 2023-11-09 NOTE — Patient Instructions (Signed)
 For areas treated with Liquid Nitrogen:  Keep clean with soap and water.  Apply Vaseline or Aquaphor twice daily.  Important Information  Due to recent changes in healthcare laws, you may see results of your pathology and/or laboratory studies on MyChart before the doctors have had a chance to review them. We understand that in some cases there may be results that are confusing or concerning to you. Please understand that not all results are received at the same time and often the doctors may need to interpret multiple results in order to provide you with the best plan of care or course of treatment. Therefore, we ask that you please give [place] 2 business days to thoroughly review all your results before contacting the office for clarification. Should we see a critical lab result, you will be contacted sooner.   If You Need Anything After Your Visit  If you have any questions or concerns for your doctor, please call our main line at [phone] If no one answers, please leave a voicemail as directed and we will return your call as soon as possible. Messages left after 4 pm will be answered the following business day.   You may also send [place] a message via MyChart. We typically respond to MyChart messages within 1-2 business days.  For prescription refills, please ask your pharmacy to contact our office. Our fax number is [phone].  If you have an urgent issue when the clinic is closed that cannot wait until the next business day, you can page your doctor at the number below.    Please note that while we do our best to be available for urgent issues outside of office hours, we are not available 24/7.   If you have an urgent issue and are unable to reach [place], you may choose to seek medical care at your doctor's office, retail clinic, urgent care center, or emergency room.  If you have a medical emergency, please immediately call 911 or go to the emergency department. In the event of inclement  weather, please call our main line at [phone] for an update on the status of any delays or closures.  Dermatology Medication Tips: Please keep the boxes that topical medications come in in order to help keep track of the instructions about where and how to use these. Pharmacies typically print the medication instructions only on the boxes and not directly on the medication tubes.   If your medication is too expensive, please contact our office at [phone] or send [place] a message through MyChart.   We are unable to tell what your co-pay for medications will be in advance as this is different depending on your insurance coverage. However, we may be able to find a substitute medication at lower cost or fill out paperwork to get insurance to cover a needed medication.   If a prior authorization is required to get your medication covered by your insurance company, please allow [place] 1-2 business days to complete this process.  Drug prices often vary depending on where the prescription is filled and some pharmacies may offer cheaper prices.  The website www.goodrx.com contains coupons for medications through different pharmacies. The prices here do not account for what the cost may be with help from insurance (it may be cheaper with your insurance), but the website can give you the price if you did not use any insurance.  - You can print the associated coupon and take it with your prescription to the pharmacy.  -  You may also stop by our office during regular business hours and pick up a GoodRx coupon card.  - If you need your prescription sent electronically to a different pharmacy, notify our office through [facility] or by phone at [phone]    Skin Education :   I counseled the patient regarding the following: Sun screen (SPF 30 or greater) should be applied during peak UV exposure (between 10am and 2pm) and reapplied after exercise or swimming.  The ABCDEs of melanoma were  reviewed with the patient, and the importance of monthly self-examination of moles was emphasized. Should any moles change in shape or color, or itch, bleed or burn, pt will contact our office for evaluation sooner then their interval appointment.  Plan: Sunscreen Recommendations I recommended a broad spectrum sunscreen with a SPF of 30 or higher. I explained that SPF 30 sunscreens block approximately 97 percent of the sun's harmful rays. Sunscreens should be applied at least 15 minutes prior to expected sun exposure and then every 2 hours after that as long as sun exposure continues. If swimming or exercising sunscreen should be reapplied every 45 minutes to an hour after getting wet or sweating. One ounce, or the equivalent of a shot glass full of sunscreen, is adequate to protect the skin not covered by a bathing suit. I also recommended a lip balm with a sunscreen as well. Sun protective clothing can be used in lieu of sunscreen but must be worn the entire time you are exposed to the sun's rays.

## 2023-11-09 NOTE — Progress Notes (Signed)
   Follow-Up Visit   Subjective  Margaret Dougherty is a 68 y.o. female who presents for the following: Skin Cancer Screening and Full Body Skin Exam  The patient presents for Total-Body Skin Exam (TBSE) for skin cancer screening and mole check. The patient has spots, moles and lesions to be evaluated, some may be new or changing.  The following portions of the chart were reviewed this encounter and updated as appropriate: medications, allergies, medical history  Review of Systems:  No other skin or systemic complaints except as noted in HPI or Assessment and Plan.  Objective  Well appearing patient in no apparent distress; mood and affect are within normal limits.  A full examination was performed including scalp, head, eyes, ears, nose, lips, neck, chest, axillae, abdomen, back, buttocks, bilateral upper extremities, bilateral lower extremities, hands, feet, fingers, toes, fingernails, and toenails. All findings within normal limits unless otherwise noted below.   Relevant physical exam findings are noted in the Assessment and Plan.    Assessment & Plan   SKIN CANCER SCREENING PERFORMED TODAY.  ACTINIC DAMAGE - Chronic condition, secondary to cumulative UV/sun exposure - diffuse scaly erythematous macules with underlying dyspigmentation - Recommend daily broad spectrum sunscreen SPF 30+ to sun-exposed areas, reapply every 2 hours as needed.  - Staying in the shade or wearing long sleeves, sun glasses (UVA+UVB protection) and wide brim hats (4-inch brim around the entire circumference of the hat) are also recommended for sun protection.  - Call for new or changing lesions.  LENTIGINES, SEBORRHEIC KERATOSES, HEMANGIOMAS - Benign normal skin lesions - Benign-appearing - Call for any changes  MELANOCYTIC NEVI - Tan-brown and/or pink-flesh-colored symmetric macules and papules - Benign appearing on exam today - Observation - Call clinic for new or changing moles - Recommend daily  use of broad spectrum spf 30+ sunscreen to sun-exposed areas.   HISTORY OF SQUAMOUS CELL CARCINOMA OF THE SKIN - No evidence of recurrence today - No lymphadenopathy - Recommend regular full body skin exams - Recommend daily broad spectrum sunscreen SPF 30+ to sun-exposed areas, reapply every 2 hours as needed.  - Call if any new or changing lesions are noted between office visits   HISTORY OF PRECANCEROUS ACTINIC KERATOSIS - site(s) of PreCancerous Actinic Keratosis clear today. - these may recur and new lesions may form requiring treatment to prevent transformation into skin cancer - observe for new or changing spots and contact [facility] for appointment if occur - photoprotection with sun protective clothing; sunglasses and broad spectrum sunscreen with SPF of at least 30 + and frequent self skin exams recommended - yearly exams by a dermatologist recommended for persons with history of PreCancerous Actinic Keratoses    Return in about 6 months (around 05/08/2024) for TBSC.  I, Berwyn Lesches, Surg Tech III, am acting as scribe for RUFUS CHRISTELLA HOLY, MD.   Documentation: I have reviewed the above documentation for accuracy and completeness, and I agree with the above.  RUFUS CHRISTELLA HOLY, MD

## 2023-11-23 DIAGNOSIS — Z23 Encounter for immunization: Secondary | ICD-10-CM | POA: Diagnosis not present

## 2023-12-27 DIAGNOSIS — M810 Age-related osteoporosis without current pathological fracture: Secondary | ICD-10-CM | POA: Diagnosis not present

## 2023-12-27 DIAGNOSIS — E7800 Pure hypercholesterolemia, unspecified: Secondary | ICD-10-CM | POA: Diagnosis not present

## 2023-12-27 DIAGNOSIS — Z131 Encounter for screening for diabetes mellitus: Secondary | ICD-10-CM | POA: Diagnosis not present

## 2023-12-27 DIAGNOSIS — Z0000 Encounter for general adult medical examination without abnormal findings: Secondary | ICD-10-CM | POA: Diagnosis not present

## 2024-01-01 DIAGNOSIS — Z1231 Encounter for screening mammogram for malignant neoplasm of breast: Secondary | ICD-10-CM | POA: Diagnosis not present

## 2024-01-12 DIAGNOSIS — R928 Other abnormal and inconclusive findings on diagnostic imaging of breast: Secondary | ICD-10-CM | POA: Diagnosis not present

## 2024-01-12 DIAGNOSIS — N6489 Other specified disorders of breast: Secondary | ICD-10-CM | POA: Diagnosis not present

## 2024-02-09 DIAGNOSIS — Z780 Asymptomatic menopausal state: Secondary | ICD-10-CM | POA: Diagnosis not present

## 2024-05-14 ENCOUNTER — Encounter: Admitting: Dermatology
